# Patient Record
Sex: Female | Born: 1935 | Race: White | Hispanic: No | Marital: Married | Smoking: Never smoker
Health system: Southern US, Community
[De-identification: ages and names within clinical notes are randomized; demographics above are authoritative.]

## PROBLEM LIST (undated history)

## (undated) DIAGNOSIS — F0390 Unspecified dementia without behavioral disturbance: Secondary | ICD-10-CM

## (undated) DIAGNOSIS — K219 Gastro-esophageal reflux disease without esophagitis: Secondary | ICD-10-CM

## (undated) DIAGNOSIS — I509 Heart failure, unspecified: Secondary | ICD-10-CM

## (undated) DIAGNOSIS — I4891 Unspecified atrial fibrillation: Secondary | ICD-10-CM

## (undated) DIAGNOSIS — I10 Essential (primary) hypertension: Secondary | ICD-10-CM

## (undated) HISTORY — PX: ABDOMINAL HYSTERECTOMY: SHX81

## (undated) HISTORY — PX: CHOLECYSTECTOMY: SHX55

## (undated) HISTORY — DX: Essential (primary) hypertension: I10

## (undated) HISTORY — DX: Gastro-esophageal reflux disease without esophagitis: K21.9

## (undated) HISTORY — PX: HERNIA REPAIR: SHX51

---

## 2009-12-27 ENCOUNTER — Ambulatory Visit (HOSPITAL_COMMUNITY): Admission: RE | Admit: 2009-12-27 | Discharge: 2009-12-27 | Payer: Self-pay | Admitting: Ophthalmology

## 2010-01-03 ENCOUNTER — Ambulatory Visit (HOSPITAL_COMMUNITY): Admission: RE | Admit: 2010-01-03 | Discharge: 2010-01-03 | Payer: Self-pay | Admitting: Ophthalmology

## 2010-05-24 LAB — POCT I-STAT 4, (NA,K, GLUC, HGB,HCT)
Glucose, Bld: 97 mg/dL (ref 70–99)
HCT: 37 % (ref 36.0–46.0)
Hemoglobin: 12.6 g/dL (ref 12.0–15.0)
Potassium: 3.4 mEq/L — ABNORMAL LOW (ref 3.5–5.1)
Potassium: 3.8 mEq/L (ref 3.5–5.1)
Sodium: 142 mEq/L (ref 135–145)

## 2015-03-13 HISTORY — PX: BRONCHOSCOPY: SUR163

## 2015-10-16 ENCOUNTER — Emergency Department (HOSPITAL_COMMUNITY)
Admission: EM | Admit: 2015-10-16 | Discharge: 2015-10-16 | Disposition: A | Discharge status: Home / Self Care | Payer: Medicare Other | Attending: Emergency Medicine | Admitting: Emergency Medicine

## 2015-10-16 ENCOUNTER — Emergency Department (HOSPITAL_COMMUNITY): Payer: Medicare Other

## 2015-10-16 ENCOUNTER — Encounter (HOSPITAL_COMMUNITY): Payer: Self-pay

## 2015-10-16 DIAGNOSIS — I10 Essential (primary) hypertension: Secondary | ICD-10-CM | POA: Insufficient documentation

## 2015-10-16 DIAGNOSIS — R5383 Other fatigue: Secondary | ICD-10-CM | POA: Diagnosis not present

## 2015-10-16 DIAGNOSIS — R05 Cough: Secondary | ICD-10-CM | POA: Insufficient documentation

## 2015-10-16 DIAGNOSIS — Z79899 Other long term (current) drug therapy: Secondary | ICD-10-CM | POA: Insufficient documentation

## 2015-10-16 DIAGNOSIS — R600 Localized edema: Secondary | ICD-10-CM | POA: Diagnosis not present

## 2015-10-16 DIAGNOSIS — Z7982 Long term (current) use of aspirin: Secondary | ICD-10-CM | POA: Diagnosis not present

## 2015-10-16 DIAGNOSIS — R609 Edema, unspecified: Secondary | ICD-10-CM

## 2015-10-16 DIAGNOSIS — I6782 Cerebral ischemia: Secondary | ICD-10-CM | POA: Insufficient documentation

## 2015-10-16 HISTORY — DX: Unspecified atrial fibrillation: I48.91

## 2015-10-16 HISTORY — DX: Essential (primary) hypertension: I10

## 2015-10-16 LAB — URINALYSIS, ROUTINE W REFLEX MICROSCOPIC
BILIRUBIN URINE: NEGATIVE
Glucose, UA: NEGATIVE mg/dL
KETONES UR: NEGATIVE mg/dL
Leukocytes, UA: NEGATIVE
Nitrite: NEGATIVE
PROTEIN: 100 mg/dL — AB
Specific Gravity, Urine: 1.025 (ref 1.005–1.030)
pH: 6.5 (ref 5.0–8.0)

## 2015-10-16 LAB — HEPATIC FUNCTION PANEL
ALBUMIN: 3.5 g/dL (ref 3.5–5.0)
ALT: 13 U/L — ABNORMAL LOW (ref 14–54)
AST: 22 U/L (ref 15–41)
Alkaline Phosphatase: 62 U/L (ref 38–126)
BILIRUBIN DIRECT: 0.4 mg/dL (ref 0.1–0.5)
BILIRUBIN TOTAL: 1.3 mg/dL — AB (ref 0.3–1.2)
Indirect Bilirubin: 0.9 mg/dL (ref 0.3–0.9)
Total Protein: 7.1 g/dL (ref 6.5–8.1)

## 2015-10-16 LAB — CBG MONITORING, ED: GLUCOSE-CAPILLARY: 104 mg/dL — AB (ref 65–99)

## 2015-10-16 LAB — CBC
HCT: 31.5 % — ABNORMAL LOW (ref 36.0–46.0)
Hemoglobin: 9.7 g/dL — ABNORMAL LOW (ref 12.0–15.0)
MCH: 25.9 pg — AB (ref 26.0–34.0)
MCHC: 30.8 g/dL (ref 30.0–36.0)
MCV: 84.2 fL (ref 78.0–100.0)
PLATELETS: 227 10*3/uL (ref 150–400)
RBC: 3.74 MIL/uL — AB (ref 3.87–5.11)
RDW: 16.7 % — ABNORMAL HIGH (ref 11.5–15.5)
WBC: 7.6 10*3/uL (ref 4.0–10.5)

## 2015-10-16 LAB — BASIC METABOLIC PANEL
Anion gap: 6 (ref 5–15)
BUN: 17 mg/dL (ref 6–20)
CALCIUM: 8.2 mg/dL — AB (ref 8.9–10.3)
CO2: 24 mmol/L (ref 22–32)
CREATININE: 0.76 mg/dL (ref 0.44–1.00)
Chloride: 109 mmol/L (ref 101–111)
GFR calc non Af Amer: >60 mL/min (ref >60)
Glucose, Bld: 112 mg/dL — ABNORMAL HIGH (ref 65–99)
Potassium: 3.8 mmol/L (ref 3.5–5.1)
SODIUM: 139 mmol/L (ref 135–145)

## 2015-10-16 LAB — URINE MICROSCOPIC-ADD ON

## 2015-10-16 LAB — TSH: TSH: 2.315 u[IU]/mL (ref 0.350–4.500)

## 2015-10-16 LAB — TROPONIN I: Troponin I: <0.03 ng/mL (ref <0.03)

## 2015-10-16 MED ORDER — FUROSEMIDE 20 MG PO TABS: 20.0000 mg | ORAL_TABLET | Freq: Every day | ORAL | 0 refills | Status: DC

## 2015-10-16 NOTE — ED Provider Notes (Signed)
[facility]-EM[facility]Y DEPT Provider Note   CSN: [id number] Arrival date & time: 10/16/15  1121  First Provider Contact:  11:47 AM    By signing my name below, I, Placido Sou, attest that this documentation has been prepared under the direction and in the presence of Eber Hong, MD. Electronically Signed: Placido Sou, ED Scribe. 10/16/15. 12:01 PM.   History   Chief Complaint Chief Complaint  Patient presents with  . Fatigue    HPI HPI Comments: Emily [name] is a 82 y.o. female who presents to the Emergency Department complaining of constant, moderate, fatigue x 2 weeks. Her daughter states she has been "sleeping day and night". Her daughter states that in March she aspirated a pill and had a bronchoscopy performed to remove the pill and went into a-fib for the first time while at the hospital. Since, her daughter states her a-fib has continued and she was recently dx with LE cellulitis and recently completed keflex. Her daughter states she had blood work completed 2 weeks ago showing low hemoglobin and iron levels and was put on gluconate 325 mg, 2x per day and her daughter states her symptoms have progressively worsened. She takes metoprolol and Cardizem daily and additionally is on Xarelto. Her daughter denies she has began any recent new medications. Her daughter denies she has been dx with CHF. Her daughter reports she began experiencing a mild cough a few days ago. Her daughter states she is supposed to use O2 at night but is not compliant. Her daughter denies she has experienced n/v/d, bloody stools and black/tarry stools.   The history is provided by the patient and a relative. No language interpreter was used.    Past Medical History:  Diagnosis Date  . A-fib (HCC)   . Hypertension     There are no active problems to display for this patient.   Past Surgical History:  Procedure Laterality Date  . ABDOMINAL HYSTERECTOMY    . CESAREAN SECTION    . CHOLECYSTECTOMY    .  HERNIA REPAIR      OB History    No data available       Home Medications    Prior to Admission medications   Medication Sig Start Date End Date Taking? Authorizing Provider  aspirin EC 81 MG tablet Take 81 mg by mouth daily.   Yes Historical Provider, MD  Calcium Carbonate (CALCIUM 600 PO) Take 600 mg by mouth 2 (two) times daily.   Yes Historical Provider, MD  cetirizine (ZYRTEC) 10 MG tablet Take 10 mg by mouth daily.   Yes Historical Provider, MD  diltiazem (CARDIZEM) 120 MG tablet Take 120 mg by mouth 2 (two) times daily.   Yes Historical Provider, MD  famotidine (PEPCID) 20 MG tablet Take 20 mg by mouth at bedtime.   Yes Historical Provider, MD  Ferrous Fumarate (HEMOCYTE - 106 MG FE) 324 (106 Fe) MG TABS tablet Take 1 tablet by mouth 2 (two) times daily.   Yes Historical Provider, MD  metoprolol (LOPRESSOR) 50 MG tablet Take 50 mg by mouth 2 (two) times daily.   Yes Historical Provider, MD  montelukast (SINGULAIR) 10 MG tablet Take 10 mg by mouth every evening.   Yes Historical Provider, MD  oxybutynin (DITROPAN) 5 MG tablet Take 5 mg by mouth daily.   Yes Historical Provider, MD  pantoprazole (PROTONIX) 40 MG tablet Take 40 mg by mouth daily.   Yes Historical Provider, MD  rivaroxaban (XARELTO) 20 MG TABS tablet Take 20  mg by mouth daily with supper.   Yes Historical Provider, MD  Vitamin D, Cholecalciferol, 1000 units TABS Take 1,000 mg by mouth daily.   Yes Historical Provider, MD  furosemide (LASIX) 20 MG tablet Take 1 tablet (20 mg total) by mouth daily. 10/16/15 10/26/15  Eber Hong, MD    Family History No family history on file.  Social History Social History  Substance Use Topics  . Smoking status: Never Smoker  . Smokeless tobacco: Never Used  . Alcohol use No     Allergies   Sulfa antibiotics and Quinine derivatives   Review of Systems Review of Systems  Constitutional: Positive for activity change and fatigue.  Respiratory: Positive for cough.     Cardiovascular: Positive for leg swelling.  Gastrointestinal: Negative for blood in stool, diarrhea, nausea and vomiting.   Physical Exam Updated Vital Signs BP 123/88   Pulse 92   Temp 97.1 F (36.2 C) (Oral)   Resp 26   Ht 5\' 4"  (1.626 m)   Wt 235 lb (106.6 kg)   SpO2 92%   BMI 40.34 kg/m   Physical Exam  Constitutional: She is oriented to person, place, and time. She appears well-developed. No distress.  HENT:  Head: Normocephalic and atraumatic.  Eyes: EOM are normal.  Neck: Normal range of motion.  Cardiovascular: Normal rate.   Atrial fibrillation   Pulmonary/Chest: Effort normal and breath sounds normal.  Abdominal: Soft. She exhibits no distension. There is no tenderness.  Musculoskeletal: Normal range of motion.  2+ edema to BLE from mid-thighs downward   Neurological: She is alert and oriented to person, place, and time.  Speech is clear, cranial nerves III through XII are intact, memory is intact, strength is normal in all 4 extremities including grips, sensation is intact to light touch and pinprick in all 4 extremities. Coordination as tested by finger-nose-finger is normal, no limb ataxia.   Skin: Skin is warm and dry.  Psychiatric: She has a normal mood and affect. Judgment normal.  Nursing note and vitals reviewed.  ED Treatments / Results  Labs (all labs ordered are listed, but only abnormal results are displayed) Labs Reviewed  BASIC METABOLIC PANEL - Abnormal; Notable for the following:       Result Value   Glucose, Bld 112 (*)    Calcium 8.2 (*)    All other components within normal limits  CBC - Abnormal; Notable for the following:    RBC 3.74 (*)    Hemoglobin 9.7 (*)    HCT 31.5 (*)    MCH 25.9 (*)    RDW 16.7 (*)    All other components within normal limits  URINALYSIS, ROUTINE W REFLEX MICROSCOPIC (NOT AT [facility]) - Abnormal; Notable for the following:    Hgb urine dipstick MODERATE (*)    Protein, ur 100 (*)    All other components within  normal limits  HEPATIC FUNCTION PANEL - Abnormal; Notable for the following:    ALT 13 (*)    Total Bilirubin 1.3 (*)    All other components within normal limits  URINE MICROSCOPIC-ADD ON - Abnormal; Notable for the following:    Squamous Epithelial / LPF TOO NUMEROUS TO COUNT (*)    Bacteria, UA FEW (*)    All other components within normal limits  CBG MONITORING, ED - Abnormal; Notable for the following:    Glucose-Capillary 104 (*)    All other components within normal limits  TSH  TROPONIN I  EKG  EKG Interpretation None       Radiology Ct Head Wo Contrast  Result Date: 10/16/2015 CLINICAL DATA:  Fatigue for 2 weeks. EXAM: CT HEAD WITHOUT CONTRAST TECHNIQUE: Contiguous axial images were obtained from the base of the skull through the vertex without intravenous contrast. COMPARISON:  None. FINDINGS: Brain: There is mild generalized age related parenchymal atrophy with commensurate dilatation of the ventricles and sulci. Mild chronic small vessel ischemic change noted in the deep periventricular white matter regions bilaterally. Probable small old lacunar infarcts within the basal ganglia. There is no mass, hemorrhage, edema or other evidence of acute parenchymal abnormality. No extra-axial hemorrhage. Vascular: No hyperdense vessel or unexpected calcification. Skull: Negative for fracture or focal lesion. Sinuses/Orbits: No acute findings. Visualized upper paranasal sinuses are clear. Mastoid air cells are clear. Other: None. IMPRESSION: 1. No acute intracranial abnormality. No intracranial mass, hemorrhage or edema. 2. Mild atrophy and chronic ischemic changes, as detailed above. Electronically Signed   By: Bary RichardStan  Maynard M.D.   On: 10/16/2015 15:09   Dg Chest Port 1 View  Result Date: 10/16/2015 CLINICAL DATA:  82 year old female with history of shortness of breath, cough and weakness with fatigue for the past 2 weeks. EXAM: PORTABLE CHEST 1 VIEW COMPARISON:  Chest x-ray  07/20/2015.  Chest CT 07/21/2015. FINDINGS: New airspace consolidation in the right upper lobe concerning for pneumonia. Lung volumes are low. Left lung is clear. No pleural effusions. No evidence of pulmonary edema. Mild cardiomegaly. Upper mediastinal contours are distorted by patient positioning. Aortic atherosclerosis. IMPRESSION: 1. New right upper lobe opacity concerning for pneumonia. Followup PA and lateral chest X-ray is recommended in 3-4 weeks following trial of antibiotic therapy to ensure resolution and exclude underlying malignancy. 2. Mild cardiomegaly. 3. Aortic atherosclerosis. Electronically Signed   By: Trudie Reedaniel  Entrikin M.D.   On: 10/16/2015 12:28    Procedures Procedures  DIAGNOSTIC STUDIES: Oxygen Saturation is 99% on RA, normal by my interpretation.    COORDINATION OF CARE: 11:58 AM Discussed next steps with pt and her daughter. They verbalized understanding and are agreeable with the plan.    Medications Ordered in ED Medications - No data to display   Initial Impression / Assessment and Plan / ED Course  I have reviewed the triage vital signs and the nursing notes.  Pertinent labs & imaging results that were available during my care of the patient were reviewed by me and considered in my medical decision making (see chart for details).  Clinical Course  Comment By Time  Well appearing throughout the ED stay Labs ordered and reviewed CXR ordered and reviewed CT reviewed No acute fidnigns - VS remained stable Can f/u outpatient Peripheral edema - 10 days of lasix  Daughter in agreement Eber HongBrian Murry Khiev, MD 08/06 1530   I personally performed the services described in this documentation, which was scribed in my presence. The recorded information has been reviewed and is accurate.     Final Clinical Impressions(s) / ED Diagnoses   Final diagnoses:  Other fatigue  Peripheral edema    New Prescriptions New Prescriptions   FUROSEMIDE (LASIX) 20 MG TABLET     Take 1 tablet (20 mg total) by mouth daily.     Eber HongBrian Jahnasia Tatum, MD 10/16/15 [phone]

## 2015-10-16 NOTE — ED Notes (Signed)
Pt made aware to return if symptoms worsen or if any life threatening symptoms occur.   

## 2015-10-16 NOTE — ED Notes (Signed)
Pt placed on 2L 02 nasal cannula per order.

## 2015-10-16 NOTE — Discharge Instructions (Signed)
Please call your doctor today, make an appointment for the next couple of days, you should be seen to have a discussion regarding your medications as this may be causing some sleepiness, there was no obvious answer based on blood work x-rays or CT scan. Please take Lasix once daily for the next 10 days to help with some of the swelling in her legs. Return to the emergency department for severe or worsening symptoms

## 2015-10-16 NOTE — ED Triage Notes (Signed)
Patient complains of fatigue x 2 weeks. States she has been sleeping almost all day and night. Had blood work 2 weeks ago and noted to have low hemoglobin per daughter.

## 2017-05-28 ENCOUNTER — Encounter: Payer: Self-pay | Admitting: Gastroenterology

## 2017-05-28 ENCOUNTER — Ambulatory Visit (INDEPENDENT_AMBULATORY_CARE_PROVIDER_SITE_OTHER): Payer: Medicare Other | Admitting: Gastroenterology

## 2017-05-28 DIAGNOSIS — K649 Unspecified hemorrhoids: Secondary | ICD-10-CM | POA: Diagnosis not present

## 2017-05-28 MED ORDER — HYDROCORTISONE 2.5 % RE CREA
1.0000 | TOPICAL_CREAM | Freq: Two times a day (BID) | RECTAL | 1 refills | Status: DC
Start: 2017-05-28 — End: 2017-09-03

## 2017-05-28 NOTE — Patient Instructions (Addendum)
I have given you samples of Amitiza for constipation. This is going to help you have more spontaneous, productive bowel movements. Take this twice a day WITH FOOD to avoid nausea. We can increase the dosage if we need to!  I have sent in anusol cream to use twice a day per rectum. If you feel you need stronger, I can send something to [facility] that may be around 40-45$. Just let me know.  I will see you in about 4 weeks. We can take a better look and reassess the options. The most important thing is to develop a good bowel regimen, avoid straining, and have improvement in symptoms.   It was a pleasure to see you today. I strive to create trusting relationships with patients to provide genuine, compassionate, and quality care. I value your feedback. If you receive a survey regarding your visit,  I greatly appreciate you taking time to fill this out.   Gelene MinkAnna W. Sherita Decoste, PhD, ANP-BC [facility]    Hemorrhoids Hemorrhoids are swollen veins in and around the rectum or anus. There are two types of hemorrhoids:  Internal hemorrhoids. These occur in the veins that are just inside the rectum. They may poke through to the outside and become irritated and painful.  External hemorrhoids. These occur in the veins that are outside of the anus and can be felt as a painful swelling or hard lump near the anus.  Most hemorrhoids do not cause serious problems, and they can be managed with home treatments such as diet and lifestyle changes. If home treatments do not help your symptoms, procedures can be done to shrink or remove the hemorrhoids. What are the causes? This condition is caused by increased pressure in the anal area. This pressure may result from various things, including:  Constipation.  Straining to have a bowel movement.  Diarrhea.  Pregnancy.  Obesity.  Sitting for long periods of time.  Heavy lifting or other activity that causes you to strain.  Anal  sex.  What are the signs or symptoms? Symptoms of this condition include:  Pain.  Anal itching or irritation.  Rectal bleeding.  Leakage of stool (feces).  Anal swelling.  One or more lumps around the anus.  How is this diagnosed? This condition can often be diagnosed through a visual exam. Other exams or tests may also be done, such as:  Examination of the rectal area with a gloved hand (digital rectal exam).  Examination of the anal canal using a small tube (anoscope).  A blood test, if you have lost a significant amount of blood.  A test to look inside the colon (sigmoidoscopy or colonoscopy).  How is this treated? This condition can usually be treated at home. However, various procedures may be done if dietary changes, lifestyle changes, and other home treatments do not help your symptoms. These procedures can help make the hemorrhoids smaller or remove them completely. Some of these procedures involve surgery, and others do not. Common procedures include:  Rubber band ligation. Rubber bands are placed at the base of the hemorrhoids to cut off the blood supply to them.  Sclerotherapy. Medicine is injected into the hemorrhoids to shrink them.  Infrared coagulation. A type of light energy is used to get rid of the hemorrhoids.  Hemorrhoidectomy surgery. The hemorrhoids are surgically removed, and the veins that supply them are tied off.  Stapled hemorrhoidopexy surgery. A circular stapling device is used to remove the hemorrhoids and use staples to cut off  the blood supply to them.  Follow these instructions at home: Eating and drinking  Eat foods that have a lot of fiber in them, such as whole grains, beans, nuts, fruits, and vegetables. Ask your health care provider about taking products that have added fiber (fiber supplements).  Drink enough fluid to keep your urine clear or pale yellow. Managing pain and swelling  Take warm sitz baths for 20 minutes, 3-4 times  a day to ease pain and discomfort.  If directed, apply ice to the affected area. Using ice packs between sitz baths may be helpful. ? Put ice in a plastic bag. ? Place a towel between your skin and the bag. ? Leave the ice on for 20 minutes, 2-3 times a day. General instructions  Take over-the-counter and prescription medicines only as told by your health care provider.  Use medicated creams or suppositories as told.  Exercise regularly.  Go to the bathroom when you have the urge to have a bowel movement. Do not wait.  Avoid straining to have bowel movements.  Keep the anal area dry and clean. Use wet toilet paper or moist towelettes after a bowel movement.  Do not sit on the toilet for long periods of time. This increases blood pooling and pain. Contact a health care provider if:  You have increasing pain and swelling that are not controlled by treatment or medicine.  You have uncontrolled bleeding.  You have difficulty having a bowel movement, or you are unable to have a bowel movement.  You have pain or inflammation outside the area of the hemorrhoids. This information is not intended to replace advice given to you by your health care provider. Make sure you discuss any questions you have with your health care provider. Document Released: 02/24/2000 Document Revised: 07/27/2015 Document Reviewed: 11/10/2014 Elsevier Interactive Patient Education  [employer].

## 2017-05-29 ENCOUNTER — Encounter: Payer: Self-pay | Admitting: Internal Medicine

## 2017-05-30 ENCOUNTER — Encounter: Payer: Self-pay | Admitting: Gastroenterology

## 2017-05-30 NOTE — Progress Notes (Signed)
Primary Care Physician:  Kirstie Peri, MD Primary Gastroenterologist:  Dr. Jena Gauss   Chief Complaint  Patient presents with  . Hemorrhoids    off/on bleeding    HPI:   Emily [name] is a 84 y.o. female presenting today at the request of Dr. Sherryll Burger secondary to hemorrhoids. Her daughter is present with her today. Patient lives at daughter's home. Patient reports to sitting long periods of time. Has had chronic hemorrhoids but have worsened over the past few years. States the hemorrhoids are "huge" at times. Rectal bleeding intermittently for the past 6 months. Associated rectal pain, sometimes knife-like pain intermittently. Sitz baths have helped in the past but now not as helpful. Has tried multiple OTC agents. Supplemental fiber taken. Endorses straining although stool is soft. Feels like stool is not productive. No abdominal pain. Has a good appetite. Has a BM at least daily. Sometimes has to pick stool out. Takes 1/2 dose of Miralax in coffee each morning. Colonoscopy greater than 10 years ago per patient. No polyps.   Past Medical History:  Diagnosis Date  . A-fib (HCC)   . GERD (gastroesophageal reflux disease)   . HTN (hypertension)   . Hypertension     Past Surgical History:  Procedure Laterality Date  . ABDOMINAL HYSTERECTOMY    . BRONCHOSCOPY  2017   aspirated what was felt to be iron tablet, evaluated at [facility] with bronchs  . CESAREAN SECTION    . CHOLECYSTECTOMY    . HERNIA REPAIR      Current Outpatient Medications  Medication Sig Dispense Refill  . aspirin EC 81 MG tablet Take 81 mg by mouth daily.    . cetirizine (ZYRTEC) 10 MG tablet Take 10 mg by mouth daily.    Marland Kitchen diltiazem (CARDIZEM) 120 MG tablet Take 120 mg by mouth every morning.     . donepezil (ARICEPT) 5 MG tablet Take 5 mg by mouth at bedtime.    . famotidine (PEPCID) 20 MG tablet Take 20 mg by mouth at bedtime.    . Ferrous Fumarate (HEMOCYTE - 106 MG FE) 324 (106 Fe) MG TABS tablet Take 1 tablet by  mouth daily.     . metoprolol (LOPRESSOR) 50 MG tablet Take 50 mg by mouth 2 (two) times daily.    . montelukast (SINGULAIR) 10 MG tablet Take 10 mg by mouth every evening.    . Multiple Vitamin (MULTIVITAMIN) tablet Take 1 tablet by mouth daily.    Marland Kitchen oxybutynin (DITROPAN) 5 MG tablet Take 5 mg by mouth daily.    . TORSEMIDE PO Take 50 mg by mouth every other day.    . furosemide (LASIX) 20 MG tablet Take 1 tablet (20 mg total) by mouth daily. 10 tablet 0  . hydrocortisone (ANUSOL-HC) 2.5 % rectal cream Place 1 application rectally 2 (two) times daily. 30 g 1   No current facility-administered medications for this visit.     Allergies as of 05/28/2017 - Review Complete 05/28/2017  Allergen Reaction Noted  . Sulfa antibiotics Rash 05/22/2015  . Quinine derivatives Other (See Comments) 10/16/2015    Family History  Problem Relation Age of Onset  . Colon cancer Neg Hx   . Colon polyps Neg Hx     Social History   Socioeconomic History  . Marital status: Married    Spouse name: Not on file  . Number of children: Not on file  . Years of education: Not on file  . Highest education level: Not on  file  Occupational History  . Not on file  Social Needs  . Financial resource strain: Not on file  . Food insecurity:    Worry: Not on file    Inability: Not on file  . Transportation needs:    Medical: Not on file    Non-medical: Not on file  Tobacco Use  . Smoking status: Never Smoker  . Smokeless tobacco: Never Used  Substance and Sexual Activity  . Alcohol use: No  . Drug use: No  . Sexual activity: Not on file  Lifestyle  . Physical activity:    Days per week: Not on file    Minutes per session: Not on file  . Stress: Not on file  Relationships  . Social connections:    Talks on phone: Not on file    Gets together: Not on file    Attends religious service: Not on file    Active member of club or organization: Not on file    Attends meetings of clubs or organizations:  Not on file    Relationship status: Not on file  . Intimate partner violence:    Fear of current or ex partner: Not on file    Emotionally abused: Not on file    Physically abused: Not on file    Forced sexual activity: Not on file  Other Topics Concern  . Not on file  Social History Narrative  . Not on file    Review of Systems: Gen: Denies any fever, chills, fatigue, weight loss, lack of appetite.  CV: Denies chest pain, heart palpitations, peripheral edema, syncope.  Resp: Denies shortness of breath at rest or with exertion. Denies wheezing or cough.  GI: see HPI  GU : Denies urinary burning, urinary frequency, urinary hesitancy MS: Denies joint pain, muscle weakness, cramps, or limitation of movement.  Derm: Denies rash, itching, dry skin Psych: Denies depression, anxiety, memory loss, and confusion Heme: see HPI   Physical Exam: BP 127/72   Pulse 66   Temp (!) 97.1 F (36.2 C) (Oral)   Ht  (1.626 m)   Wt 245 lb 3.2 oz (111.2 kg)   BMI 42.09 kg/m  General:   Alert and oriented. Pleasant and cooperative. Well-nourished and well-developed.  Head:  Normocephalic and atraumatic. Eyes:  Without icterus, sclera clear and conjunctiva pink.  Ears:  Normal auditory acuity. Nose:  No deformity, discharge,  or lesions. Mouth:  No deformity or lesions, oral mucosa pink.  Lungs:  Clear to auscultation bilaterally. No wheezes, rales, or rhonchi. No distress.  Heart:  S1, S2 present irregularly irregular  Abdomen:  +BS, soft, non-tender and non-distended.limited exam with patient in chair Rectal:  Unable to lay in left lateral decubitus position on exam table, as she was unable to get on this even with assistance. She was able to lean over, and she had what appears to be Grade 3 prolapsing internal hemorrhoids, non-thrombosed, able to reduce with digital exam. Unable to adequately evaluate internally due to patient position  Msk:  With kyphosis  Extremities:  With chronic lower  extremity edema, ?lymphedema component  Neurologic:  Alert and  oriented x4 Psych:  Alert and cooperative. Normal mood and affect.

## 2017-05-30 NOTE — Progress Notes (Signed)
CC'ED TO PCP 

## 2017-05-30 NOTE — Assessment & Plan Note (Signed)
Very pleasant 84 year old female with chronic history of hemorrhoids per her report, now with worsening symptoms, intermittent discomfort and low-volume hematochezia. Rectal exam limited as she was unable to get on table, but I appreciated what appeared to be likely Grade 3 non-thrombosed hemorrhoids. Reduced manually. Unable to adequately perform internal rectal exam.   Discussed at length more aggressive bowel regimen, as she notes sitting for long periods of time, straining, and needs to avoid both of these with improved bowel regimen in order to note any improvement. We discussed possible hemorrhoid banding as well, and the risks and benefits of colonoscopy vs flex sig with subsequent banding as we are unable to completely exclude any other etiologies. For now, will add Amitiza 8 mcg po BID, samples provided. Anusol sent to pharmacy. If this is not helpful, we may need to do the [facility] compound cream. Return in 4 weeks. I briefly discussed case with Dr. Jena Gaussourk. As patient is older, she may elect to just do hemorrhoid banding in future and forgo colonoscopy, with the knowledge that an occult lesion could be present. If banding did not improve symptoms, could pursue colonoscopy. However, if she is willing, would pursue flex sig vs colonoscopy with banding thereafter. I did discuss with her that I was not completely sure she would be an appropriate candidate for banding, especially if hemorrhoids worsen. For now, she could be a potential candidate with Grade 3 but any worsening would not be amenable to banding. Will maximize supportive measures for now and discuss in 4 weeks.

## 2017-06-03 ENCOUNTER — Telehealth: Payer: Self-pay | Admitting: Internal Medicine

## 2017-06-03 MED ORDER — LUBIPROSTONE 8 MCG PO CAPS: 8.0000 ug | ORAL_CAPSULE | Freq: Two times a day (BID) | ORAL | 3 refills | Status: DC

## 2017-06-03 NOTE — Telephone Encounter (Signed)
Forwarding to the refill box.  

## 2017-06-03 NOTE — Telephone Encounter (Signed)
Done

## 2017-06-03 NOTE — Addendum Note (Signed)
Addended by: Gelene MinkBOONE, Rolando Hessling W on: 06/03/2017 12:42 PM   Modules accepted: Orders

## 2017-06-03 NOTE — Telephone Encounter (Signed)
PATIENT DAUGHTER SAID THE AMITEZA SAMPLES WORKED WELL AND WOULD LIKE A PRESCRIPTION SENT TO HER PHARMACY

## 2017-06-26 ENCOUNTER — Ambulatory Visit: Payer: Medicare Other | Admitting: Gastroenterology

## 2017-07-01 ENCOUNTER — Ambulatory Visit: Payer: Medicare Other | Admitting: Gastroenterology

## 2017-07-25 ENCOUNTER — Encounter: Payer: Self-pay | Admitting: Cardiovascular Disease

## 2017-07-30 ENCOUNTER — Encounter: Payer: Self-pay | Admitting: Gastroenterology

## 2017-07-30 ENCOUNTER — Ambulatory Visit (INDEPENDENT_AMBULATORY_CARE_PROVIDER_SITE_OTHER): Payer: Medicare Other | Admitting: Gastroenterology

## 2017-07-30 VITALS — BP 107/60 | HR 81 | Temp 97.7°F | Ht 64.0 in | Wt 249.6 lb

## 2017-07-30 DIAGNOSIS — K649 Unspecified hemorrhoids: Secondary | ICD-10-CM

## 2017-07-30 NOTE — Patient Instructions (Addendum)
I called in the [facility] and they will be calling you for more information and review pricing.   Use the suppositories twice a day for the next 7 days.  The cream can be as needed during a flare, which you can use 2-4 times a day.   I will see you in 4 months or sooner if needed! Please call with any issues whatsoever.  Continue taking the Amitiza twice a day with food. Please call if we need to adjust this dosage in the future.  It was wonderful to see you again!!!! I look forward to seeing you in the fall  Gelene Mink, PhD, [facility]

## 2017-07-30 NOTE — Progress Notes (Signed)
Primary Care Physician:  Kirstie Peri, MD  Primary GI: Dr. Jena Gauss   Chief Complaint  Patient presents with  . Hemorrhoids    HPI:   Emily [name] is an 84 y.o. female presenting today with a history of hemorrhoids, constipation, last seen in March 2019. Limited view of rectum at that time, as she was unable to get on the exam table easily. Appeared to have possibly Grade 3 hemorrhoids, non-thrombosed, reduced manually. At that time, started on Amitiza 8 mcg. Anusol provided as well. Discussed possibility of hemorrhoid banding with colonoscopy at a later date only if persistent issues. Could also consider flex sig with outpatient banding thereafter, knowing an occult lesion could be present.   Presents today with her daughter, [name]. In interim from last visit, she was hospitalized at [facility] due to electrolyte abnormalities, dehydration, hypotension. Diuretic therapy has been adjusted. Daughter states dealing with heart failure recently. Amitiza 8 mcg BID helped significantly with bowel movements. Daughter has also added colace in the evenings. She did have an issue with impaction in interim, presenting to [facility]. Received enemas and had good results. AAS from [facility] with stool throughout colon April 2019. She has done well since that time. Will have occasional episodes of bleeding, with intermittent prolapsing of hemorrhoids. She will go spans of time without any bleeding. She has no pain. She wonders if she is a candidate for hemorrhoid banding. She also tells me she is wearing oxygen prn as well. Seems to have declined some in health. Daughter feels hemorrhoids are overall improved.   Past Medical History:  Diagnosis Date  . A-fib (HCC)   . GERD (gastroesophageal reflux disease)   . HTN (hypertension)   . Hypertension     Past Surgical History:  Procedure Laterality Date  . ABDOMINAL HYSTERECTOMY    . BRONCHOSCOPY  2017   aspirated what was felt to be iron tablet,  evaluated at [facility] with bronchs  . CESAREAN SECTION    . CHOLECYSTECTOMY    . HERNIA REPAIR      Current Outpatient Medications  Medication Sig Dispense Refill  . aspirin EC 81 MG tablet Take 81 mg by mouth daily.    . cetirizine (ZYRTEC) 10 MG tablet Take 10 mg by mouth daily.    Marland Kitchen diltiazem (CARDIZEM) 120 MG tablet Take 120 mg by mouth every morning.     . donepezil (ARICEPT) 5 MG tablet Take 5 mg by mouth at bedtime.    . famotidine (PEPCID) 20 MG tablet Take 20 mg by mouth at bedtime.    . Ferrous Fumarate (HEMOCYTE - 106 MG FE) 324 (106 Fe) MG TABS tablet Take 1 tablet by mouth daily.     . hydrocortisone (ANUSOL-HC) 2.5 % rectal cream Place 1 application rectally 2 (two) times daily. 30 g 1  . lubiprostone (AMITIZA) 8 MCG capsule Take 1 capsule (8 mcg total) by mouth 2 (two) times daily with a meal. 180 capsule 3  . metoprolol (LOPRESSOR) 50 MG tablet Take 50 mg by mouth 2 (two) times daily.    . montelukast (SINGULAIR) 10 MG tablet Take 10 mg by mouth every evening.    . Multiple Vitamin (MULTIVITAMIN) tablet Take 1 tablet by mouth daily.    . NON FORMULARY Potassium 20 meq   Once a day    . oxybutynin (DITROPAN) 5 MG tablet Take 5 mg by mouth daily.    . TORSEMIDE PO Take 100 mg by mouth daily.     Marland Kitchen  furosemide (LASIX) 20 MG tablet Take 1 tablet (20 mg total) by mouth daily. 10 tablet 0  . hydrocortisone (ANUSOL-HC) 25 MG suppository Place 1 suppository (25 mg total) rectally every 12 (twelve) hours. 24 suppository 1   No current facility-administered medications for this visit.     Allergies as of 07/30/2017 - Review Complete 07/30/2017  Allergen Reaction Noted  . Sulfa antibiotics Rash 05/22/2015  . Quinine derivatives Other (See Comments) 10/16/2015    Family History  Problem Relation Age of Onset  . Colon cancer Neg Hx   . Colon polyps Neg Hx     Social History   Socioeconomic History  . Marital status: Married    Spouse name: Not on file  . Number of  children: Not on file  . Years of education: Not on file  . Highest education level: Not on file  Occupational History  . Not on file  Social Needs  . Financial resource strain: Not on file  . Food insecurity:    Worry: Not on file    Inability: Not on file  . Transportation needs:    Medical: Not on file    Non-medical: Not on file  Tobacco Use  . Smoking status: Never Smoker  . Smokeless tobacco: Never Used  Substance and Sexual Activity  . Alcohol use: No  . Drug use: No  . Sexual activity: Not on file  Lifestyle  . Physical activity:    Days per week: Not on file    Minutes per session: Not on file  . Stress: Not on file  Relationships  . Social connections:    Talks on phone: Not on file    Gets together: Not on file    Attends religious service: Not on file    Active member of club or organization: Not on file    Attends meetings of clubs or organizations: Not on file    Relationship status: Not on file  Other Topics Concern  . Not on file  Social History Narrative  . Not on file    Review of Systems: As mentioned in HPI   Physical Exam: BP 107/60   Pulse 81   Temp 97.7 F (36.5 C) (Oral)   Ht  (1.626 m)   Wt 249 lb 9.6 oz (113.2 kg)   BMI 42.84 kg/m  General:   Alert and oriented. No distress noted. Pleasant and cooperative.  Head:  Normocephalic and atraumatic. Eyes:  Conjuctiva clear without scleral icterus. Mouth:  Oral mucosa pink and moist.  Rectal: unable to get on exam table. Bending over with daughter's assistance, I was able to see the rectum. Appears to possibly be Grade 3-4 non-bleeding hemorrhoids. With rectal exam, I could manually reduce but would not stay reduced. No evidence of thrombosis, bleeding.  Msk:  Kyphosis  Extremities:  Lymphedema bilateral lower extremities  Neurologic:  Alert and  oriented x4 Psych:  Alert and cooperative. Normal mood and affect.

## 2017-07-31 ENCOUNTER — Telehealth: Payer: Self-pay | Admitting: General Practice

## 2017-07-31 MED ORDER — HYDROCORTISONE ACETATE 25 MG RE SUPP: 25.0000 mg | Freq: Two times a day (BID) | RECTAL | 1 refills | Status: AC

## 2017-07-31 NOTE — Telephone Encounter (Signed)
Noted   Sent in

## 2017-07-31 NOTE — Telephone Encounter (Signed)
Completed. I sent in Anusol suppositories, but was this what she is referring to?

## 2017-07-31 NOTE — Telephone Encounter (Signed)
Spoke with pts daughter and the Anusol Suppositories is what she was referring to.

## 2017-07-31 NOTE — Addendum Note (Signed)
Addended by: Gelene Mink on: 07/31/2017 12:00 PM   Modules accepted: Orders

## 2017-07-31 NOTE — Telephone Encounter (Signed)
Patient's daughter, [name] called and wanted to know if we could call in more suppositories for her mom.    She wanted to increase the quantity of the Rx, because they are using them more frequently.   Pts daughter can be reached at  [phone].

## 2017-08-05 NOTE — Assessment & Plan Note (Signed)
Very pleasant delightful 84 year old female presenting again with her daughter for follow-up of hemorrhoids and constipation. Will continue Amitiza 8 mcg dosing BID. I discussed trial of 24 mcg BID, but she feels this is doing well. Still with intermittent rectal bleeding but improved. No significant rectal discomfort. At this point, hemorrhoid flares are more of an "annoyance". Unfortunately, she has had some health issues in the interim, to where I feel it is best to hold off on any flex sig or colonoscopy prior to hemorrhoid banding. In this scenario, I discussed that limited evaluation in the way of flex sig prior to any outpatient hemorrhoid banding would be best, although that does not exclude an occult lesion elsewhere in the remaining colon. As she is improved, we discussed supportive measures for now and allow her to recover from recent heart failure exacerbation. Continue Amitiza dosing, and I have added the [facility] cream for supportive measures. She is to do a 7 day course of BID suppositories starting today, with the compounded hemorrhoid cream in the future as needed. It has been difficult to completely assess her in the office as she is unable to get on the exam table, but overall I think she has symptomatically improved. We will see her again in 4 months. I also discussed with her that outpatient banding is done in our office, and if she is unable to get on table, we would need to discuss other ways to achieve this. For now, will continue to monitor due to other health issues that have arisen.

## 2017-08-06 NOTE — Progress Notes (Signed)
cc'd to pcp 

## 2017-08-15 ENCOUNTER — Encounter

## 2017-08-15 ENCOUNTER — Encounter: Payer: Self-pay | Admitting: Cardiovascular Disease

## 2017-08-15 ENCOUNTER — Ambulatory Visit (INDEPENDENT_AMBULATORY_CARE_PROVIDER_SITE_OTHER): Payer: Medicare Other | Admitting: Cardiovascular Disease

## 2017-08-15 ENCOUNTER — Telehealth: Payer: Self-pay | Admitting: Cardiovascular Disease

## 2017-08-15 ENCOUNTER — Encounter: Payer: Self-pay | Admitting: *Deleted

## 2017-08-15 VITALS — BP 100/50 | HR 89 | Ht 64.0 in | Wt 267.0 lb

## 2017-08-15 DIAGNOSIS — R600 Localized edema: Secondary | ICD-10-CM | POA: Diagnosis not present

## 2017-08-15 DIAGNOSIS — Z01818 Encounter for other preprocedural examination: Secondary | ICD-10-CM

## 2017-08-15 DIAGNOSIS — I5022 Chronic systolic (congestive) heart failure: Secondary | ICD-10-CM | POA: Diagnosis not present

## 2017-08-15 DIAGNOSIS — I481 Persistent atrial fibrillation: Secondary | ICD-10-CM | POA: Diagnosis not present

## 2017-08-15 DIAGNOSIS — I10 Essential (primary) hypertension: Secondary | ICD-10-CM

## 2017-08-15 DIAGNOSIS — I4819 Other persistent atrial fibrillation: Secondary | ICD-10-CM

## 2017-08-15 DIAGNOSIS — Z9289 Personal history of other medical treatment: Secondary | ICD-10-CM

## 2017-08-15 NOTE — Patient Instructions (Addendum)
Medication Instructions:  Continue all current medications.  Labwork: none  Testing/Procedures:  Your physician has requested that you have an echocardiogram. Echocardiography is a painless test that uses sound waves to create images of your heart. It provides your doctor with information about the size and shape of your heart and how well your heart's chambers and valves are working. This procedure takes approximately one hour. There are no restrictions for this procedure. (with contrast)  Office will contact with results via phone or letter.    Follow-Up: 4-6 weeks   Any Other Special Instructions Will Be Listed Below (If Applicable).  If you need a refill on your cardiac medications before your next appointment, please call your pharmacy.

## 2017-08-15 NOTE — Progress Notes (Signed)
CARDIOLOGY CONSULT NOTE  Patient ID: Emily [name] MRN: [id number] DOB/AGE: December 16, 1931 84 y.o.  Admit date: (Not on file) Primary Physician: Kirstie Peri, MD Referring Physician: Kirstie Peri, MD  Reason for Consultation: Paroxysmal atrial fibrillation and lower extremity edema  HPI: Emily [name] is a 84 y.o. female who is being seen today for the evaluation of paroxysmal atrial fibrillation and lower extremity edema at the request of Kirstie Peri, MD.   She was last evaluated by cardiology on 02/05/2017 according to electronic medical record.  The most recent echocardiogram I find in the system is dated 05/26/2015 and showed mildly reduced left ventricular systolic function, LVEF 45 to 40%, mild concentric LVH, diffuse hypokinesis, moderate left atrial and mild right atrial dilatation.  There was mild aortic and tricuspid regurgitation.  I personally reviewed the ECG performed today which demonstrates atrial fibrillation, 71 bpm.  There were nonspecific T wave abnormalities.  She is here with her daughter, Emily [name], who is 1 of 5 children.  She was apparently hospitalized at [facility] between May 17 and May 19.  She had been on 4 days of oral diuretics including torsemide and metolazone but blood pressure was dropping and the patient began to feel progressively weak.  She was found to be hypokalemic.  I do not have hospitalization records but will request them for personal review.  She saw her PCP this past Monday.  She is currently on torsemide 100 mg daily and metolazone 2.5 mg daily.  She is due for blood test this upcoming Monday.  She adheres to a 1200 mg sodium restricted diet and a 2 L fluid restriction.  She denies a history of myocardial infarction.  She also denies having had a cardiac catheterization or stress test.  She said problems began a few years ago when she inadvertently swallowed an iron pill and it had to be extracted.  From that time on she developed  atrial fibrillation.  She has reportedly been on Xarelto in the past but she suffered several side effects and anticoagulation was discontinued.  She weighs at home daily.  Her daughter says that her mother is most comfortable at 230 pounds.  She weighed 252 pounds on 5/24 and weighs 259 pounds today.  She just began the current oral diuretic regimen on June 4.  She also has a history of asthma and receives a breathing treatment once daily.  She is also receiving occupational and physical therapy.  There is no known history of sleep apnea and the patient's daughter says that the patient rarely snores and there have been no witnessed apneic episodes.  I am told venous Dopplers were done 1 to 2 years ago by her PCP.  She uses 2 L of oxygen at home.  The patient denies chest pain and shortness of breath as well as palpitations.  Social history: The patient is originally from the [place] area.  They then moved to [place].  They currently live in [place] and have lived here for at least 20 years.  The patient's family are big [employer] basketball fans.   Allergies  Allergen Reactions  . Sulfa Antibiotics Rash  . Quinine Derivatives Other (See Comments)    Bleeding from mouth. Affteced bone marrow. Decreased WBC.    Current Outpatient Medications  Medication Sig Dispense Refill  . aspirin EC 81 MG tablet Take 81 mg by mouth daily.    . cetirizine (ZYRTEC) 10 MG tablet Take 10  mg by mouth daily.    Marland Kitchen diltiazem (CARDIZEM) 120 MG tablet Take 120 mg by mouth 2 (two) times daily.     Marland Kitchen donepezil (ARICEPT) 5 MG tablet Take 5 mg by mouth at bedtime.    . famotidine (PEPCID) 20 MG tablet Take 20 mg by mouth at bedtime.    . Ferrous Fumarate (HEMOCYTE - 106 MG FE) 324 (106 Fe) MG TABS tablet Take 1 tablet by mouth daily.     . hydrocortisone (ANUSOL-HC) 2.5 % rectal cream Place 1 application rectally 2 (two) times daily. 30 g 1  . hydrocortisone (ANUSOL-HC) 25 MG  suppository Place 1 suppository (25 mg total) rectally every 12 (twelve) hours. 24 suppository 1  . lubiprostone (AMITIZA) 24 MCG capsule Take 24 mcg by mouth daily with breakfast.    . metoprolol (LOPRESSOR) 50 MG tablet Take 50 mg by mouth 2 (two) times daily.    . montelukast (SINGULAIR) 10 MG tablet Take 10 mg by mouth every evening.    . Multiple Vitamin (MULTIVITAMIN) tablet Take 1 tablet by mouth daily.    . NON FORMULARY Potassium 20 meq   Once a day    . oxybutynin (DITROPAN) 5 MG tablet Take 5 mg by mouth daily.    . TORSEMIDE PO Take 100 mg by mouth daily.      No current facility-administered medications for this visit.     Past Medical History:  Diagnosis Date  . A-fib (HCC)   . GERD (gastroesophageal reflux disease)   . HTN (hypertension)   . Hypertension     Past Surgical History:  Procedure Laterality Date  . ABDOMINAL HYSTERECTOMY    . BRONCHOSCOPY  2017   aspirated what was felt to be iron tablet, evaluated at [facility] with bronchs  . CESAREAN SECTION    . CHOLECYSTECTOMY    . HERNIA REPAIR      Social History   Socioeconomic History  . Marital status: Married    Spouse name: Not on file  . Number of children: Not on file  . Years of education: Not on file  . Highest education level: Not on file  Occupational History  . Not on file  Social Needs  . Financial resource strain: Not on file  . Food insecurity:    Worry: Not on file    Inability: Not on file  . Transportation needs:    Medical: Not on file    Non-medical: Not on file  Tobacco Use  . Smoking status: Never Smoker  . Smokeless tobacco: Never Used  Substance and Sexual Activity  . Alcohol use: No  . Drug use: No  . Sexual activity: Not on file  Lifestyle  . Physical activity:    Days per week: Not on file    Minutes per session: Not on file  . Stress: Not on file  Relationships  . Social connections:    Talks on phone: Not on file    Gets together: Not on file    Attends  religious service: Not on file    Active member of club or organization: Not on file    Attends meetings of clubs or organizations: Not on file    Relationship status: Not on file  . Intimate partner violence:    Fear of current or ex partner: Not on file    Emotionally abused: Not on file    Physically abused: Not on file    Forced sexual activity: Not on file  Other Topics  Concern  . Not on file  Social History Narrative  . Not on file     No family history of premature CAD in 1st degree relatives.  Current Meds  Medication Sig  . aspirin EC 81 MG tablet Take 81 mg by mouth daily.  . cetirizine (ZYRTEC) 10 MG tablet Take 10 mg by mouth daily.  Marland Kitchen. diltiazem (CARDIZEM) 120 MG tablet Take 120 mg by mouth 2 (two) times daily.   Marland Kitchen. donepezil (ARICEPT) 5 MG tablet Take 5 mg by mouth at bedtime.  . famotidine (PEPCID) 20 MG tablet Take 20 mg by mouth at bedtime.  . Ferrous Fumarate (HEMOCYTE - 106 MG FE) 324 (106 Fe) MG TABS tablet Take 1 tablet by mouth daily.   . hydrocortisone (ANUSOL-HC) 2.5 % rectal cream Place 1 application rectally 2 (two) times daily.  . hydrocortisone (ANUSOL-HC) 25 MG suppository Place 1 suppository (25 mg total) rectally every 12 (twelve) hours.  Marland Kitchen. lubiprostone (AMITIZA) 24 MCG capsule Take 24 mcg by mouth daily with breakfast.  . metoprolol (LOPRESSOR) 50 MG tablet Take 50 mg by mouth 2 (two) times daily.  . montelukast (SINGULAIR) 10 MG tablet Take 10 mg by mouth every evening.  . Multiple Vitamin (MULTIVITAMIN) tablet Take 1 tablet by mouth daily.  . NON FORMULARY Potassium 20 meq   Once a day  . oxybutynin (DITROPAN) 5 MG tablet Take 5 mg by mouth daily.  . TORSEMIDE PO Take 100 mg by mouth daily.       Review of systems complete and found to be negative unless listed above in HPI    Physical exam Blood pressure (!) 100/50, pulse 89, height 5\' 4"  (1.626 m), weight 267 lb (121.1 kg), SpO2 95 %. General: NAD Neck: No JVD, no thyromegaly or thyroid  nodule.  Lungs: Clear to auscultation bilaterally with normal respiratory effort. CV: Nondisplaced PMI. Regular rate and irregular rhythm, normal S1, S2, no S3, 2/6 systolic murmur heard throughout precordium.  Bilateral lower extremities are markedly edematous.  No carotid bruit.   Abdomen: Soft, nontender, no distention.  Neurologic: Alert and oriented x 3.  Psych: Normal affect. Extremities: No clubbing or cyanosis.  HEENT: Normal.   ECG: Most recent ECG reviewed.   Labs: Lab Results  Component Value Date/Time   K 3.8 10/16/2015 12:06 PM   BUN 17 10/16/2015 12:06 PM   CREATININE 0.76 10/16/2015 12:06 PM   ALT 13 (L) 10/16/2015 12:06 PM   TSH 2.315 10/16/2015 12:06 PM   HGB 9.7 (L) 10/16/2015 12:06 PM     Lipids: No results found for: LDLCALC, LDLDIRECT, CHOL, TRIG, HDL      ASSESSMENT AND PLAN:  1.  Chronic systolic heart failure: She is on torsemide 100 mg daily and metolazone 2.5 mg daily which was begun on June 4.  She has been tried on Lasix in the past without success.  I am told she has significant bilateral venous varicosities.  She also has gross lymphedema.  She is due for upcoming blood work by her PCP next Monday.  She weighs daily at home and adheres to a 1200 mg sodium restricted diet and a 2 L fluid restriction.  I will obtain an echocardiogram with contrast to evaluate cardiac structure and function.  I would like to see what her right ventricular function is to see if there is a component of right heart failure.  She may eventually require right heart catheterization to assess intracardiac pressures.  She is on supplemental potassium 20  mEq daily.  There is no known history of sleep apnea and there have been no witnessed apneic episodes.  2.  Persistent atrial fibrillation: She has not been on anticoagulation due to anemia as per records.  Most recent hemoglobin I find in the electronic medical record was normal at 12.8 on 05/30/2015.  I will obtain her most recent  CBC.  Heart rate is currently controlled on long-acting diltiazem 120 mg twice daily and metoprolol tartrate 50 mg twice daily.  Records indicate she has been on digoxin in the past.  She is on low-dose aspirin.  We talked at length about alternative anticoagulation strategies.  She reportedly sustained side effects from Xarelto.  I talked about the possibility of apixaban.  I also talked about the elevated thromboembolic risk.  3.  Hypertension: Blood pressure is presently controlled on long-acting diltiazem and metoprolol.  No changes.   Disposition: Follow up in 4-6 weeks   Signed: Prentice Docker, M.D., F.A.C.C.  08/15/2017, 8:30 AM

## 2017-08-15 NOTE — Telephone Encounter (Signed)
Pre-cert Verification for the following procedure   [facility]ity [facility]

## 2017-08-19 ENCOUNTER — Ambulatory Visit (HOSPITAL_COMMUNITY): Payer: Medicare Other

## 2017-08-19 ENCOUNTER — Telehealth: Payer: Self-pay | Admitting: *Deleted

## 2017-08-19 ENCOUNTER — Ambulatory Visit: Payer: Medicare Other | Admitting: Cardiovascular Disease

## 2017-08-19 ENCOUNTER — Encounter

## 2017-08-19 NOTE — Telephone Encounter (Signed)
Pt daughter says pt fell over the weekend and was to sore and bruised [facility] - will call back to reschedule. Will forward to provider and nurse as Lorain ChildesFYI

## 2017-08-20 NOTE — Telephone Encounter (Signed)
Noted  

## 2017-08-27 ENCOUNTER — Telehealth: Payer: Self-pay | Admitting: Cardiovascular Disease

## 2017-08-27 NOTE — Telephone Encounter (Signed)
Having problems w/ her BP still and having fluid and the meds aren't working. Pt fell about a week ago. Please give daughter Rosalie DoctorDala a call [phone]

## 2017-08-27 NOTE — Telephone Encounter (Signed)
Returned call to Rosalie DoctorDala (pt's daughter) She states that her mother is having problems with her blood pressure dropping too low (98/52) when the nurse came out to do physical therapy. She continues to weigh around 259 lbs, even after taking torsemide 100 mg daily and metolazone 2.5 mg daily. She states that she feels the fluid pills are not working ans wonders if there is another they could try. Please advise.

## 2017-08-28 ENCOUNTER — Telehealth: Payer: Self-pay | Admitting: Cardiovascular Disease

## 2017-08-28 ENCOUNTER — Other Ambulatory Visit: Payer: Self-pay

## 2017-08-28 DIAGNOSIS — R609 Edema, unspecified: Secondary | ICD-10-CM

## 2017-08-28 NOTE — Telephone Encounter (Signed)
Spoke with pt's daughter. She states that she is wanting to know if the fluid medication could be changed for her mother. Per Dr. Purvis SheffieldKoneswaran, he needs to have pt obtain echo prior to medication change. Pt's daughter voiced understanding. Echo scheduled for Monday 6/24 @ 10:30

## 2017-08-28 NOTE — Telephone Encounter (Signed)
Will need to see echocardiogram results before advising on diuretics.

## 2017-08-28 NOTE — Telephone Encounter (Signed)
Pt's daughter is calling back concerning what she called for yesterday

## 2017-08-30 ENCOUNTER — Telehealth: Payer: Self-pay | Admitting: Cardiovascular Disease

## 2017-08-30 NOTE — Telephone Encounter (Signed)
Emily [name] with [facility] called stating that patient's weight was 255 on 08/27/17 and 260 on 08/30/2017. States that patient is weak and having pain in her feet. Please call Emily [name] [phone].

## 2017-08-30 NOTE — Telephone Encounter (Signed)
Daughter [name](Dala) notified & verbalized understanding.

## 2017-08-30 NOTE — Telephone Encounter (Signed)
[facility]

## 2017-08-30 NOTE — Telephone Encounter (Signed)
Daughter Lear Ng(Dala Stanley) returning call - No chest pain, dizziness, or SOB.  1-2 days c/o pain in feet in general due to her carrying the weight.  30 lbs of fluid gained over the last 3-4 months per daughter.  Saw Dr. Sherryll BurgerShah today.  Suggested that she may need to be admitted over the weekend to get fluid off.  Echo had to be rescheduled to Monday, 09/02/2017.  Very tired & fatigued.  Daughter does state that they plan on taking her to ED in not improving, but wanted to know what cardiology provider would suggest.  Stated that Dr. Sherryll BurgerShah increased KCl from 20meq to 40meq, doing Torsemide 100mg  daily - pmd added another one tab at lunch & is doing the Metolazone 2.5mg  30 minutes prior to the Trosemide.  Stated that none of this seems to be working for her.

## 2017-08-30 NOTE — Telephone Encounter (Signed)
Then she will need hospitalization for IV diuresis. Would recommend [facility].

## 2017-08-30 NOTE — Telephone Encounter (Signed)
Left message to return call 

## 2017-08-31 ENCOUNTER — Encounter (HOSPITAL_COMMUNITY): Payer: Self-pay

## 2017-08-31 ENCOUNTER — Inpatient Hospital Stay (HOSPITAL_COMMUNITY)
Admission: EM | Admit: 2017-08-31 | Discharge: 2017-09-12 | DRG: 291 | Disposition: A | Discharge status: Hospice | Payer: Medicare Other | Attending: Pulmonary Disease | Admitting: Pulmonary Disease

## 2017-08-31 ENCOUNTER — Other Ambulatory Visit: Payer: Self-pay

## 2017-08-31 ENCOUNTER — Emergency Department (HOSPITAL_COMMUNITY): Payer: Medicare Other

## 2017-08-31 DIAGNOSIS — Z7189 Other specified counseling: Secondary | ICD-10-CM | POA: Diagnosis not present

## 2017-08-31 DIAGNOSIS — J9621 Acute and chronic respiratory failure with hypoxia: Secondary | ICD-10-CM | POA: Diagnosis present

## 2017-08-31 DIAGNOSIS — D631 Anemia in chronic kidney disease: Secondary | ICD-10-CM | POA: Diagnosis present

## 2017-08-31 DIAGNOSIS — R579 Shock, unspecified: Secondary | ICD-10-CM | POA: Diagnosis not present

## 2017-08-31 DIAGNOSIS — N183 Chronic kidney disease, stage 3 (moderate): Secondary | ICD-10-CM | POA: Diagnosis present

## 2017-08-31 DIAGNOSIS — Z515 Encounter for palliative care: Secondary | ICD-10-CM | POA: Diagnosis present

## 2017-08-31 DIAGNOSIS — I50812 Chronic right heart failure: Secondary | ICD-10-CM

## 2017-08-31 DIAGNOSIS — R451 Restlessness and agitation: Secondary | ICD-10-CM | POA: Diagnosis not present

## 2017-08-31 DIAGNOSIS — Z6841 Body Mass Index (BMI) 40.0 and over, adult: Secondary | ICD-10-CM | POA: Diagnosis not present

## 2017-08-31 DIAGNOSIS — K219 Gastro-esophageal reflux disease without esophagitis: Secondary | ICD-10-CM | POA: Diagnosis present

## 2017-08-31 DIAGNOSIS — I071 Rheumatic tricuspid insufficiency: Secondary | ICD-10-CM | POA: Diagnosis present

## 2017-08-31 DIAGNOSIS — I5082 Biventricular heart failure: Secondary | ICD-10-CM | POA: Diagnosis present

## 2017-08-31 DIAGNOSIS — I130 Hypertensive heart and chronic kidney disease with heart failure and stage 1 through stage 4 chronic kidney disease, or unspecified chronic kidney disease: Secondary | ICD-10-CM | POA: Diagnosis present

## 2017-08-31 DIAGNOSIS — I5084 End stage heart failure: Secondary | ICD-10-CM | POA: Diagnosis present

## 2017-08-31 DIAGNOSIS — I480 Paroxysmal atrial fibrillation: Secondary | ICD-10-CM | POA: Diagnosis present

## 2017-08-31 DIAGNOSIS — I5043 Acute on chronic combined systolic (congestive) and diastolic (congestive) heart failure: Secondary | ICD-10-CM | POA: Diagnosis present

## 2017-08-31 DIAGNOSIS — L299 Pruritus, unspecified: Secondary | ICD-10-CM | POA: Diagnosis not present

## 2017-08-31 DIAGNOSIS — I959 Hypotension, unspecified: Secondary | ICD-10-CM | POA: Diagnosis present

## 2017-08-31 DIAGNOSIS — J9601 Acute respiratory failure with hypoxia: Secondary | ICD-10-CM | POA: Diagnosis not present

## 2017-08-31 DIAGNOSIS — N179 Acute kidney failure, unspecified: Secondary | ICD-10-CM

## 2017-08-31 DIAGNOSIS — M7989 Other specified soft tissue disorders: Secondary | ICD-10-CM | POA: Diagnosis present

## 2017-08-31 DIAGNOSIS — E8809 Other disorders of plasma-protein metabolism, not elsewhere classified: Secondary | ICD-10-CM | POA: Diagnosis present

## 2017-08-31 DIAGNOSIS — I482 Chronic atrial fibrillation: Secondary | ICD-10-CM | POA: Diagnosis not present

## 2017-08-31 DIAGNOSIS — E873 Alkalosis: Secondary | ICD-10-CM | POA: Diagnosis present

## 2017-08-31 DIAGNOSIS — R946 Abnormal results of thyroid function studies: Secondary | ICD-10-CM | POA: Diagnosis present

## 2017-08-31 DIAGNOSIS — Z9181 History of falling: Secondary | ICD-10-CM

## 2017-08-31 DIAGNOSIS — J9600 Acute respiratory failure, unspecified whether with hypoxia or hypercapnia: Secondary | ICD-10-CM

## 2017-08-31 DIAGNOSIS — M109 Gout, unspecified: Secondary | ICD-10-CM | POA: Diagnosis present

## 2017-08-31 DIAGNOSIS — J9811 Atelectasis: Secondary | ICD-10-CM | POA: Diagnosis present

## 2017-08-31 DIAGNOSIS — Z95828 Presence of other vascular implants and grafts: Secondary | ICD-10-CM

## 2017-08-31 DIAGNOSIS — Z9049 Acquired absence of other specified parts of digestive tract: Secondary | ICD-10-CM

## 2017-08-31 DIAGNOSIS — R0602 Shortness of breath: Secondary | ICD-10-CM

## 2017-08-31 DIAGNOSIS — I10 Essential (primary) hypertension: Secondary | ICD-10-CM | POA: Diagnosis not present

## 2017-08-31 DIAGNOSIS — E860 Dehydration: Secondary | ICD-10-CM | POA: Diagnosis present

## 2017-08-31 DIAGNOSIS — I878 Other specified disorders of veins: Secondary | ICD-10-CM | POA: Diagnosis present

## 2017-08-31 DIAGNOSIS — F419 Anxiety disorder, unspecified: Secondary | ICD-10-CM | POA: Diagnosis present

## 2017-08-31 DIAGNOSIS — G4733 Obstructive sleep apnea (adult) (pediatric): Secondary | ICD-10-CM | POA: Diagnosis present

## 2017-08-31 DIAGNOSIS — Z90710 Acquired absence of both cervix and uterus: Secondary | ICD-10-CM

## 2017-08-31 DIAGNOSIS — I272 Pulmonary hypertension, unspecified: Secondary | ICD-10-CM | POA: Diagnosis present

## 2017-08-31 DIAGNOSIS — Z66 Do not resuscitate: Secondary | ICD-10-CM | POA: Diagnosis present

## 2017-08-31 DIAGNOSIS — F0390 Unspecified dementia without behavioral disturbance: Secondary | ICD-10-CM | POA: Diagnosis present

## 2017-08-31 DIAGNOSIS — R570 Cardiogenic shock: Secondary | ICD-10-CM | POA: Diagnosis present

## 2017-08-31 DIAGNOSIS — I481 Persistent atrial fibrillation: Secondary | ICD-10-CM | POA: Diagnosis present

## 2017-08-31 DIAGNOSIS — E039 Hypothyroidism, unspecified: Secondary | ICD-10-CM | POA: Diagnosis present

## 2017-08-31 DIAGNOSIS — I5023 Acute on chronic systolic (congestive) heart failure: Secondary | ICD-10-CM

## 2017-08-31 DIAGNOSIS — J45909 Unspecified asthma, uncomplicated: Secondary | ICD-10-CM | POA: Diagnosis present

## 2017-08-31 DIAGNOSIS — E876 Hypokalemia: Secondary | ICD-10-CM | POA: Diagnosis present

## 2017-08-31 DIAGNOSIS — Z888 Allergy status to other drugs, medicaments and biological substances status: Secondary | ICD-10-CM

## 2017-08-31 DIAGNOSIS — Z79899 Other long term (current) drug therapy: Secondary | ICD-10-CM

## 2017-08-31 DIAGNOSIS — Z7982 Long term (current) use of aspirin: Secondary | ICD-10-CM

## 2017-08-31 DIAGNOSIS — Z9981 Dependence on supplemental oxygen: Secondary | ICD-10-CM

## 2017-08-31 DIAGNOSIS — R627 Adult failure to thrive: Secondary | ICD-10-CM | POA: Diagnosis present

## 2017-08-31 DIAGNOSIS — Z882 Allergy status to sulfonamides status: Secondary | ICD-10-CM

## 2017-08-31 DIAGNOSIS — I50810 Right heart failure, unspecified: Secondary | ICD-10-CM | POA: Diagnosis not present

## 2017-08-31 DIAGNOSIS — I361 Nonrheumatic tricuspid (valve) insufficiency: Secondary | ICD-10-CM | POA: Diagnosis not present

## 2017-08-31 DIAGNOSIS — I4891 Unspecified atrial fibrillation: Secondary | ICD-10-CM | POA: Diagnosis present

## 2017-08-31 DIAGNOSIS — I50813 Acute on chronic right heart failure: Secondary | ICD-10-CM | POA: Diagnosis not present

## 2017-08-31 DIAGNOSIS — I9589 Other hypotension: Secondary | ICD-10-CM | POA: Diagnosis not present

## 2017-08-31 DIAGNOSIS — I509 Heart failure, unspecified: Secondary | ICD-10-CM

## 2017-08-31 HISTORY — DX: Heart failure, unspecified: I50.9

## 2017-08-31 HISTORY — DX: Unspecified dementia, unspecified severity, without behavioral disturbance, psychotic disturbance, mood disturbance, and anxiety: F03.90

## 2017-08-31 LAB — HEPATIC FUNCTION PANEL
ALT: 9 U/L — ABNORMAL LOW (ref 14–54)
AST: 25 U/L (ref 15–41)
Albumin: 2.4 g/dL — ABNORMAL LOW (ref 3.5–5.0)
Alkaline Phosphatase: 75 U/L (ref 38–126)
BILIRUBIN DIRECT: 0.4 mg/dL (ref 0.1–0.5)
BILIRUBIN INDIRECT: 0.7 mg/dL (ref 0.3–0.9)
TOTAL PROTEIN: 5.7 g/dL — AB (ref 6.5–8.1)
Total Bilirubin: 1.1 mg/dL (ref 0.3–1.2)

## 2017-08-31 LAB — CBC WITH DIFFERENTIAL/PLATELET
Basophils Absolute: 0 10*3/uL (ref 0.0–0.1)
Basophils Relative: 1 %
EOS ABS: 0.5 10*3/uL (ref 0.0–0.7)
EOS PCT: 9 %
HCT: 28.1 % — ABNORMAL LOW (ref 36.0–46.0)
Hemoglobin: 8.7 g/dL — ABNORMAL LOW (ref 12.0–15.0)
Lymphocytes Relative: 12 %
Lymphs Abs: 0.6 10*3/uL — ABNORMAL LOW (ref 0.7–4.0)
MCH: 27.6 pg (ref 26.0–34.0)
MCHC: 31 g/dL (ref 30.0–36.0)
MCV: 89.2 fL (ref 78.0–100.0)
MONO ABS: 0.7 10*3/uL (ref 0.1–1.0)
Monocytes Relative: 14 %
Neutro Abs: 3.3 10*3/uL (ref 1.7–7.7)
Neutrophils Relative %: 64 %
PLATELETS: 206 10*3/uL (ref 150–400)
RBC: 3.15 MIL/uL — ABNORMAL LOW (ref 3.87–5.11)
RDW: 14.9 % (ref 11.5–15.5)
WBC: 5.1 10*3/uL (ref 4.0–10.5)

## 2017-08-31 LAB — URINALYSIS, ROUTINE W REFLEX MICROSCOPIC
BACTERIA UA: NONE SEEN
Bilirubin Urine: NEGATIVE
GLUCOSE, UA: NEGATIVE mg/dL
Hgb urine dipstick: NEGATIVE
KETONES UR: NEGATIVE mg/dL
NITRITE: NEGATIVE
PROTEIN: 30 mg/dL — AB
Specific Gravity, Urine: 1.009 (ref 1.005–1.030)
pH: 6 (ref 5.0–8.0)

## 2017-08-31 LAB — BASIC METABOLIC PANEL
Anion gap: 9 (ref 5–15)
BUN: 62 mg/dL — AB (ref 6–20)
CALCIUM: 7.9 mg/dL — AB (ref 8.9–10.3)
CO2: 32 mmol/L (ref 22–32)
CREATININE: 1.4 mg/dL — AB (ref 0.44–1.00)
Chloride: 98 mmol/L — ABNORMAL LOW (ref 101–111)
GFR calc Af Amer: 39 mL/min — ABNORMAL LOW (ref >60)
GFR, EST NON AFRICAN AMERICAN: 33 mL/min — AB (ref >60)
Glucose, Bld: 115 mg/dL — ABNORMAL HIGH (ref 65–99)
Potassium: 2.6 mmol/L — CL (ref 3.5–5.1)
SODIUM: 139 mmol/L (ref 135–145)

## 2017-08-31 LAB — BRAIN NATRIURETIC PEPTIDE: B Natriuretic Peptide: 492 pg/mL — ABNORMAL HIGH (ref 0.0–100.0)

## 2017-08-31 LAB — TROPONIN I: Troponin I: <0.03 ng/mL (ref <0.03)

## 2017-08-31 MED ORDER — GUAIFENESIN-DM 100-10 MG/5ML PO SYRP
5.0000 mL | ORAL_SOLUTION | ORAL | Status: DC | PRN
  Administered 2017-09-01 – 2017-09-11 (×10): 5 mL via ORAL
  Filled 2017-08-31 (×14): qty 5

## 2017-08-31 MED ORDER — ONDANSETRON HCL 4 MG/2ML IJ SOLN
4.0000 mg | Freq: Four times a day (QID) | INTRAMUSCULAR | Status: DC | PRN
  Administered 2017-09-01 – 2017-09-07 (×4): 4 mg via INTRAVENOUS
  Filled 2017-08-31 (×4): qty 2

## 2017-08-31 MED ORDER — POTASSIUM CHLORIDE 10 MEQ/100ML IV SOLN
10.0000 meq | INTRAVENOUS | Status: AC
  Administered 2017-08-31 – 2017-09-01 (×4): 10 meq via INTRAVENOUS
  Filled 2017-08-31 (×4): qty 100

## 2017-08-31 MED ORDER — POTASSIUM CHLORIDE 10 MEQ/100ML IV SOLN
10.0000 meq | Freq: Once | INTRAVENOUS | Status: AC
  Administered 2017-08-31: 10 meq via INTRAVENOUS
  Filled 2017-08-31: qty 100

## 2017-08-31 MED ORDER — SODIUM CHLORIDE 0.9 % IV SOLN
INTRAVENOUS | Status: DC
Start: 2017-08-31 — End: 2017-09-01
  Administered 2017-08-31 – 2017-09-01 (×2): via INTRAVENOUS

## 2017-08-31 MED ORDER — ALBUTEROL SULFATE (2.5 MG/3ML) 0.083% IN NEBU: 2.5000 mg | INHALATION_SOLUTION | RESPIRATORY_TRACT | Status: DC | PRN

## 2017-08-31 MED ORDER — LUBIPROSTONE 8 MCG PO CAPS
8.0000 ug | ORAL_CAPSULE | Freq: Two times a day (BID) | ORAL | Status: DC
  Administered 2017-09-01 (×2): 8 ug via ORAL
  Filled 2017-08-31 (×7): qty 1

## 2017-08-31 MED ORDER — FAMOTIDINE 20 MG PO TABS
20.0000 mg | ORAL_TABLET | Freq: Every day | ORAL | Status: DC
  Administered 2017-08-31 – 2017-09-01 (×2): 20 mg via ORAL
  Filled 2017-08-31 (×2): qty 1

## 2017-08-31 MED ORDER — MONTELUKAST SODIUM 10 MG PO TABS
10.0000 mg | ORAL_TABLET | Freq: Every evening | ORAL | Status: DC
  Administered 2017-09-01: 10 mg via ORAL
  Filled 2017-08-31: qty 1

## 2017-08-31 MED ORDER — POTASSIUM CHLORIDE CRYS ER 20 MEQ PO TBCR
40.0000 meq | EXTENDED_RELEASE_TABLET | Freq: Three times a day (TID) | ORAL | Status: DC
  Administered 2017-08-31 – 2017-09-01 (×4): 40 meq via ORAL
  Filled 2017-08-31 (×5): qty 2

## 2017-08-31 MED ORDER — ACETAMINOPHEN 650 MG RE SUPP: 650.0000 mg | Freq: Four times a day (QID) | RECTAL | Status: DC | PRN

## 2017-08-31 MED ORDER — MAGNESIUM SULFATE 2 GM/50ML IV SOLN
2.0000 g | INTRAVENOUS | Status: AC
  Administered 2017-08-31: 2 g via INTRAVENOUS
  Filled 2017-08-31: qty 50

## 2017-08-31 MED ORDER — ENSURE ENLIVE PO LIQD
237.0000 mL | Freq: Two times a day (BID) | ORAL | Status: DC
  Administered 2017-08-31 – 2017-09-12 (×17): 237 mL via ORAL

## 2017-08-31 MED ORDER — OXYBUTYNIN CHLORIDE 5 MG PO TABS
5.0000 mg | ORAL_TABLET | Freq: Every day | ORAL | Status: DC
  Administered 2017-09-01: 5 mg via ORAL
  Filled 2017-08-31 (×2): qty 1

## 2017-08-31 MED ORDER — FERROUS FUMARATE 324 (106 FE) MG PO TABS
1.0000 | ORAL_TABLET | Freq: Every evening | ORAL | Status: DC
  Administered 2017-09-01: 106 mg via ORAL
  Filled 2017-08-31: qty 1

## 2017-08-31 MED ORDER — DONEPEZIL HCL 5 MG PO TABS
5.0000 mg | ORAL_TABLET | Freq: Every morning | ORAL | Status: DC
  Administered 2017-09-01: 5 mg via ORAL
  Filled 2017-08-31 (×2): qty 1

## 2017-08-31 MED ORDER — LORATADINE 10 MG PO TABS
10.0000 mg | ORAL_TABLET | Freq: Every day | ORAL | Status: DC
  Administered 2017-09-01: 10 mg via ORAL
  Filled 2017-08-31 (×2): qty 1

## 2017-08-31 MED ORDER — POTASSIUM CHLORIDE 10 MEQ/100ML IV SOLN
INTRAVENOUS | Status: AC
  Administered 2017-09-01: 10 meq
  Filled 2017-08-31: qty 100

## 2017-08-31 MED ORDER — ACETAMINOPHEN 325 MG PO TABS
650.0000 mg | ORAL_TABLET | Freq: Four times a day (QID) | ORAL | Status: DC | PRN
  Administered 2017-09-01 – 2017-09-07 (×10): 650 mg via ORAL
  Filled 2017-08-31 (×11): qty 2

## 2017-08-31 MED ORDER — ONDANSETRON HCL 4 MG PO TABS: 4.0000 mg | ORAL_TABLET | Freq: Four times a day (QID) | ORAL | Status: DC | PRN

## 2017-08-31 MED ORDER — ENOXAPARIN SODIUM 60 MG/0.6ML ~~LOC~~ SOLN
60.0000 mg | SUBCUTANEOUS | Status: DC
  Administered 2017-08-31 – 2017-09-01 (×2): 60 mg via SUBCUTANEOUS
  Filled 2017-08-31 (×2): qty 0.6

## 2017-08-31 MED ORDER — ASPIRIN EC 81 MG PO TBEC
81.0000 mg | DELAYED_RELEASE_TABLET | Freq: Every day | ORAL | Status: DC
  Administered 2017-09-01 – 2017-09-07 (×6): 81 mg via ORAL
  Filled 2017-08-31 (×7): qty 1

## 2017-08-31 MED ORDER — DILTIAZEM HCL ER COATED BEADS 120 MG PO CP24
120.0000 mg | ORAL_CAPSULE | Freq: Every day | ORAL | Status: DC
  Administered 2017-09-01: 120 mg via ORAL
  Filled 2017-08-31 (×2): qty 1

## 2017-08-31 NOTE — H&P (Addendum)
History and Physical  Emily [name]:[id number] DOB: 03/29/1931 DOA: 08/31/2017  Referring physician: Dr Hyacinth Meeker, ED physician PCP: Kirstie Peri, MD  Outpatient Specialists: Dr Purvis Sheffield  Patient Coming From: home  Chief Complaint: leg swelling, weight increase, sent by cardiology  HPI: Emily [name] is a 84 y.o. female with a history of dementia, paroxysmal atrial fibrillation not currently on anticoagulation due to side effects of Xarelto, right-sided CHF with systolic function between 40 to 45% on echocardiogram 2017, GERD, hypertension.  Patient is accompanied by her daughter who provides history as the patient has dementia.  Patient recently changed cardiologists and is started seeing Dr. Purvis Sheffield.  She has been struggling with diuresis over the past several weeks and has been using Lasix, torsemide, Zaroxolyn.  She has gained proximately 30 pounds over the past several weeks with increased leg swelling and woody edema.  She has become increasingly weak and talked with cardiology today, who sent her here for evaluation.  No palliating or provoking factors.  Symptoms are worsening.  Emergency Department Course: BNP slightly elevated at 400.  Chest x-ray shows mild CHF.  Patient is hypotensive with blood pressure in the mid 80s over 40s.  Her potassium is 2.6, creatinine 1.4 (baseline 0.5-1 in 2017), BUN is 62.  Additionally, her protein is 5.7 and albumin is 2.4.  Review of Systems:   Pt denies any fevers, chills, nausea, vomiting, diarrhea, constipation, abdominal pain, shortness of breath, dyspnea on exertion, orthopnea, cough, wheezing, palpitations, headache, vision changes, lightheadedness, dizziness, melena, rectal bleeding.  Review of systems are otherwise negative  Past Medical History:  Diagnosis Date  . A-fib (HCC)   . CHF (congestive heart failure) (HCC)   . GERD (gastroesophageal reflux disease)   . HTN (hypertension)   . Hypertension    Past Surgical History:    Procedure Laterality Date  . ABDOMINAL HYSTERECTOMY    . BRONCHOSCOPY  2017   aspirated what was felt to be iron tablet, evaluated at [facility] with bronchs  . CESAREAN SECTION    . CHOLECYSTECTOMY    . HERNIA REPAIR     Social History:  reports that she has never smoked. She has never used smokeless tobacco. She reports that she does not drink alcohol or use drugs. Patient lives at home  Allergies  Allergen Reactions  . Sulfa Antibiotics Rash  . Quinine Derivatives Other (See Comments)    Bleeding from mouth. Affteced bone marrow. Decreased WBC.    Family History  Problem Relation Age of Onset  . Colon cancer Neg Hx   . Colon polyps Neg Hx      Prior to Admission medications   Medication Sig Start Date End Date Taking? Authorizing Provider  albuterol (PROVENTIL) (2.5 MG/3ML) 0.083% nebulizer solution USE ONE VIAL IN NEBULIZER FOUR TIMES DAILY AS NEEDED FOR SHORTNESS OF BREATH/WHEEZING 08/18/17  Yes [provider]  aspirin EC 81 MG tablet Take 81 mg by mouth daily.   Yes [provider]  cetirizine (ZYRTEC) 10 MG tablet Take 10 mg by mouth daily.   Yes [provider]  diltiazem (CARDIZEM CD) 120 MG 24 hr capsule Take 120 mg by mouth 2 (two) times daily.   Yes [provider]  donepezil (ARICEPT) 5 MG tablet Take 5 mg by mouth every morning.    Yes [provider]  famotidine (PEPCID) 20 MG tablet Take 20 mg by mouth at bedtime.   Yes [provider]  Ferrous Fumarate (HEMOCYTE - 106 MG FE)  324 (106 Fe) MG TABS tablet Take 1 tablet by mouth every evening.    Yes [provider]  hydrocortisone (ANUSOL-HC) 2.5 % rectal cream Place 1 application rectally 2 (two) times daily. Patient taking differently: Place 1 application rectally 2 (two) times daily as needed for hemorrhoids or anal itching.  05/28/17  Yes Gelene Mink, NP  hydrocortisone (ANUSOL-HC) 25 MG suppository Place 1 suppository (25 mg total) rectally every 12  (twelve) hours. Patient taking differently: Place 25 mg rectally 2 (two) times daily as needed for hemorrhoids or anal itching.  07/31/17  Yes Gelene Mink, NP  lubiprostone (AMITIZA) 8 MCG capsule Take 8 mcg by mouth 2 (two) times daily with a meal.    Yes [provider]  metolazone (ZAROXOLYN) 2.5 MG tablet Take 2.5 mg by mouth every morning.   Yes [provider]  metoprolol (LOPRESSOR) 50 MG tablet Take 50 mg by mouth 2 (two) times daily.   Yes [provider]  montelukast (SINGULAIR) 10 MG tablet Take 10 mg by mouth every evening.   Yes [provider]  Multiple Vitamin (MULTIVITAMIN) tablet Take 1 tablet by mouth daily.   Yes [provider]  oxybutynin (DITROPAN) 5 MG tablet Take 5 mg by mouth daily.   Yes [provider]  potassium chloride SA (K-DUR,KLOR-CON) 20 MEQ tablet Take 10 mEq by mouth 2 (two) times daily.   Yes [provider]  torsemide (DEMADEX) 100 MG tablet Take 100 mg by mouth every morning.    Yes [provider]    Physical Exam: BP (!) 87/40 (BP Location: Left Arm)   Pulse 61   Temp 97.9 F (36.6 C) (Oral)   Resp 18   Ht 5\' 4"  (1.626 m)   Wt 117.9 kg (260 lb)   SpO2 92%   BMI 44.63 kg/m   . General: Elderly Caucasian female. Awake and alert and oriented x3. No acute cardiopulmonary distress.  Marland Kitchen HEENT: Normocephalic atraumatic.  Right and left ears normal in appearance.  Pupils equal, round, reactive to light. Extraocular muscles are intact. Sclerae anicteric and noninjected.  Moist mucosal membranes. No mucosal lesions.  . Neck: Neck supple without lymphadenopathy. No carotid bruits. No masses palpated.  . Cardiovascular: Regular rate with normal S1-S2 sounds. No murmurs, rubs, gallops auscultated.  Minimal JVD.  4+ woody edema with chronic venous stasis changes bilaterally. Marland Kitchen Respiratory: Good respiratory effort with no wheezes, rales, rhonchi. Lungs clear to auscultation bilaterally.  No  accessory muscle use. . Abdomen: Soft, nontender, nondistended. Active bowel sounds. No masses or hepatosplenomegaly  . Skin: No rashes, lesions, or ulcerations.  Dry, warm to touch. 2+ dorsalis pedis and radial pulses. . Musculoskeletal: No calf or leg pain. All major joints not erythematous nontender.  No upper or lower joint deformation.  Good ROM.  No contractures  . Psychiatric: Intact judgment and insight. Pleasant and cooperative. . Neurologic: No focal neurological deficits. Strength is 5/5 and symmetric in upper and lower extremities.  Cranial nerves II through XII are grossly intact.           Labs on Admission: I have personally reviewed following labs and imaging studies  CBC: Recent Labs  Lab 08/31/17 1530  WBC 5.1  NEUTROABS 3.3  HGB 8.7*  HCT 28.1*  MCV 89.2  PLT 206   Basic Metabolic Panel: Recent Labs  Lab 08/31/17 1530  NA 139  K 2.6*  CL 98*  CO2 32  GLUCOSE 115*  BUN 62*  CREATININE 1.40*  CALCIUM 7.9*   GFR: Estimated Creatinine Clearance: 37.8 mL/min (A) (by C-G formula based on SCr of 1.4 mg/dL (H)). Liver Function Tests: Recent Labs  Lab 08/31/17 1530  AST 25  ALT 9*  ALKPHOS 75  BILITOT 1.1  PROT 5.7*  ALBUMIN 2.4*   No results for input(s): LIPASE, AMYLASE in the last 168 hours. No results for input(s): AMMONIA in the last 168 hours. Coagulation Profile: No results for input(s): INR, PROTIME in the last 168 hours. Cardiac Enzymes: Recent Labs  Lab 08/31/17 1537  TROPONINI <0.03   BNP (last 3 results) No results for input(s): PROBNP in the last 8760 hours. HbA1C: No results for input(s): HGBA1C in the last 72 hours. CBG: No results for input(s): GLUCAP in the last 168 hours. Lipid Profile: No results for input(s): CHOL, HDL, LDLCALC, TRIG, CHOLHDL, LDLDIRECT in the last 72 hours. Thyroid Function Tests: No results for input(s): TSH, T4TOTAL, FREET4, T3FREE, THYROIDAB in the last 72 hours. Anemia Panel: No results for  input(s): VITAMINB12, FOLATE, FERRITIN, TIBC, IRON, RETICCTPCT in the last 72 hours. Urine analysis:    Component Value Date/Time   COLORURINE YELLOW 10/16/2015 1145   APPEARANCEUR CLEAR 10/16/2015 1145   LABSPEC 1.025 10/16/2015 1145   PHURINE 6.5 10/16/2015 1145   GLUCOSEU NEGATIVE 10/16/2015 1145   HGBUR MODERATE (A) 10/16/2015 1145   BILIRUBINUR NEGATIVE 10/16/2015 1145   KETONESUR NEGATIVE 10/16/2015 1145   PROTEINUR 100 (A) 10/16/2015 1145   NITRITE NEGATIVE 10/16/2015 1145   LEUKOCYTESUR NEGATIVE 10/16/2015 1145   Sepsis Labs: @LABRCNTIP (procalcitonin:4,lacticidven:4) )No results found for this or any previous visit (from the past 240 hour(s)).   Radiological Exams on Admission: Dg Chest Portable 1 View  Result Date: 08/31/2017 CLINICAL DATA:  84 year old female with history of swelling in the legs. Weight gain over the past 2 weeks. EXAM: PORTABLE CHEST 1 VIEW COMPARISON:  Chest x-ray 07/25/2017. FINDINGS: There is cephalization of the pulmonary vasculature and slight indistinctness of the interstitial markings suggestive of mild pulmonary edema. Small bilateral pleural effusions. Mild cardiomegaly. Aortic atherosclerosis. IMPRESSION: 1. The appearance the chest suggests mild congestive heart failure. 2. Aortic atherosclerosis. Electronically Signed   By: Trudie Reed M.D.   On: 08/31/2017 16:20    EKG: Independently reviewed.  Atrial fibrillation with no ST changes  Assessment/Plan: Principal Problem:   Hypotension Active Problems:   Hypertension   CHF (congestive heart failure) (HCC)   GERD (gastroesophageal reflux disease)   A-fib (HCC)   Hypokalemia   Hypoalbuminemia    This patient was discussed with the ED physician, including pertinent vitals, physical exam findings, labs, and imaging.  We also discussed care given by the ED provider.  1. Hypotension a. Patient intravascularly dry despite being fluid overloaded in the periphery b. We will fluid  resuscitate and hold patient's diuretics and metoprolol c. Repeat BMP tomorrow morning 2. Hypokalemia a. Will replace potassium 3. Hypoalbuminemia a. Question of whether this is increased loss through the urine versus decreased production in the liver due to CHF.  Her LFTs are normal b. This may be very difficult to treat, but is the likely etiology of the patient's peripheral edema c. Nutrition consult d. Start Ensure  supplementation 4. CHF a. We will get echocardiogram tomorrow 5. A. Fib a. Hold metoprolol, continue Cardizem b. Patient not currently on anticoagulation due to side effects 6. Hypertension a. Hold anti-hypertensives for the moment (will continue Cardizem as to prevent A. fib with RVR) 7. GERD a. Continue Protonix  DVT prophylaxis: lovenox Consultants: none Code Status: DNR Family Communication: Daughter Disposition Plan: Patient should be able to return home   Levie HeritageStinson, Ladavia Lindenbaum J, [place]DO [facility] Pager [phone]  If 7PM-7AM, please contact night-coverage [email or web address] Password [email or web address]

## 2017-08-31 NOTE — ED Provider Notes (Signed)
[facility] EMERGENCY DEPARTMENT Provider Note   CSN: [id number] Arrival date & time: 08/31/17  1520     History   Chief Complaint Chief Complaint  Patient presents with  . Leg Swelling    HPI Emily [name] is a 84 y.o. female.  HPI  The patient is an 84 year old female who has a known history of congestive heart failure as well as atrial fibrillation hypertension and chronic peripheral edema.  She is accompanied by her daughter who is an additional historian stating that she has had chronic problems with swelling, her baseline weight is 230 pounds but she has been as high as 260 pounds recently.  They have been struggling with diuresis in the past and have used medications such as Lasix, torsemide and Zaroxolyn, the latter to which she currently takes.  She is also taking frequent potassium supplementation because of the medications that caused her to be hypokalemic.  Recently the patient started to have increasing swelling, increasing weakness and thus a phone call with her doctors and her cardiologist yesterday ended up with recommendations from her cardiologist to come to the hospital and potentially be admitted to the hospital for IV diuresis.  The patient does endorse some shortness of breath, occasional cough, no fevers or chills.  Her legs are chronically swollen, chronically erythematous and the daughter who is here and is her primary caregiver states that this is no change from usual, just the increased swelling.  The patient also endorses pain in her feet when she tries to ambulate.  She does ambulate a couple of times a day, this is minimal, with a walker.  She also uses oxygen as needed during the day when her oxygen dropped to 91% and at night.  Symptoms are gradually worsening and have now become severe.  Review of the medical record shows that the patient had a most recent echocardiogram was performed in March 2017 based on prior cardiology notes showing an ejection fraction  of 45 to 50% with concentric left ventricular hypertrophy, diffuse hypokinesis and some atrial dilatation.  There was some aortic and tricuspid regurgitation.  Evidently the patient's cardiologist was originally in [place], they transferred to [place] and the patient is now started care with local cardiology and Dr. Purvis Sheffield.  She has not yet had an echocardiogram here but was scheduled for one in 48 hours  Past Medical History:  Diagnosis Date  . A-fib (HCC)   . CHF (congestive heart failure) (HCC)   . GERD (gastroesophageal reflux disease)   . HTN (hypertension)   . Hypertension     Patient Active Problem List   Diagnosis Date Noted  . Hemorrhoids 05/28/2017    Past Surgical History:  Procedure Laterality Date  . ABDOMINAL HYSTERECTOMY    . BRONCHOSCOPY  2017   aspirated what was felt to be iron tablet, evaluated at [facility] with bronchs  . CESAREAN SECTION    . CHOLECYSTECTOMY    . HERNIA REPAIR       OB History   None      Home Medications    Prior to Admission medications   Medication Sig Start Date End Date Taking? Authorizing Provider  aspirin EC 81 MG tablet Take 81 mg by mouth daily.    [provider]  cetirizine (ZYRTEC) 10 MG tablet Take 10 mg by mouth daily.    [provider]  diltiazem (CARDIZEM) 120 MG tablet Take 120 mg by mouth 2 (two) times daily.     [provider]  donepezil (ARICEPT) 5 MG tablet Take 5 mg by mouth at bedtime.    [provider]  famotidine (PEPCID) 20 MG tablet Take 20 mg by mouth at bedtime.    [provider]  Ferrous Fumarate (HEMOCYTE - 106 MG FE) 324 (106 Fe) MG TABS tablet Take 1 tablet by mouth daily.     [provider]  hydrocortisone (ANUSOL-HC) 2.5 % rectal cream Place 1 application rectally 2 (two) times daily. 05/28/17   Gelene Mink, NP  hydrocortisone (ANUSOL-HC) 25 MG suppository Place 1 suppository (25 mg total) rectally every 12 (twelve) hours. 07/31/17    Gelene Mink, NP  lubiprostone (AMITIZA) 24 MCG capsule Take 24 mcg by mouth daily with breakfast.    [provider]  metoprolol (LOPRESSOR) 50 MG tablet Take 50 mg by mouth 2 (two) times daily.    [provider]  montelukast (SINGULAIR) 10 MG tablet Take 10 mg by mouth every evening.    [provider]  Multiple Vitamin (MULTIVITAMIN) tablet Take 1 tablet by mouth daily.    [provider]  NON FORMULARY Potassium 20 meq   Once a day    [provider]  oxybutynin (DITROPAN) 5 MG tablet Take 5 mg by mouth daily.    [provider]  TORSEMIDE PO Take 100 mg by mouth daily.     [provider]    Family History Family History  Problem Relation Age of Onset  . Colon cancer Neg Hx   . Colon polyps Neg Hx     Social History Social History   Tobacco Use  . Smoking status: Never Smoker  . Smokeless tobacco: Never Used  Substance Use Topics  . Alcohol use: No  . Drug use: No     Allergies   Sulfa antibiotics and Quinine derivatives   Review of Systems Review of Systems  All other systems reviewed and are negative.    Physical Exam Updated Vital Signs BP (!) 87/40 (BP Location: Left Arm)   Pulse 61   Temp 97.9 F (36.6 C) (Oral)   Resp 18   Ht 5\' 4"  (1.626 m)   Wt 117.9 kg (260 lb)   SpO2 92%   BMI 44.63 kg/m   Physical Exam  Constitutional: She appears well-developed and well-nourished. No distress.  HENT:  Head: Normocephalic and atraumatic.  Mouth/Throat: No oropharyngeal exudate.  Mild dehdyradtion to MM  Eyes: Pupils are equal, round, and reactive to light. Conjunctivae and EOM are normal. Right eye exhibits no discharge. Left eye exhibits no discharge. No scleral icterus.  Neck: Normal range of motion. Neck supple. No JVD present. No thyromegaly present.  Cardiovascular: Normal rate, regular rhythm, normal heart sounds and intact distal pulses. Exam reveals no gallop and no friction rub.  No  murmur heard. Pulmonary/Chest: Effort normal. No respiratory distress. She has no wheezes. She has rales ( rales at the bases bilaterally, no increased work of breathing).  Abdominal: Soft. Bowel sounds are normal. She exhibits no distension and no mass. There is no tenderness.  No ana sarca, no ttp or masses  Musculoskeletal: Normal range of motion. She exhibits edema (severe pitting edema and severe symmetrical edema to LE's below the knees). She exhibits no tenderness.  Lymphadenopathy:    She has no cervical adenopathy.  Neurological: She is alert. Coordination normal.  Which is clear, coordination is normal, the patient is generally weak but able to sit up in the bed with assistance.  No cranial nerve deficits seen on exam 3 through 12  Skin: Skin is warm and dry. No rash noted. No erythema.  Erythema noted over the edematous areas of the legs bilaterally which the daughter states is chronic  Psychiatric: She has a normal mood and affect. Her behavior is normal.  Nursing note and vitals reviewed.    ED Treatments / Results  Labs (all labs ordered are listed, but only abnormal results are displayed) Labs Reviewed  CBC WITH DIFFERENTIAL/PLATELET - Abnormal; Notable for the following components:      Result Value   RBC 3.15 (*)    Hemoglobin 8.7 (*)    HCT 28.1 (*)    Lymphs Abs 0.6 (*)    All other components within normal limits  BASIC METABOLIC PANEL - Abnormal; Notable for the following components:   Potassium 2.6 (*)    Chloride 98 (*)    Glucose, Bld 115 (*)    BUN 62 (*)    Creatinine, Ser 1.40 (*)    Calcium 7.9 (*)    GFR calc non Af Amer 33 (*)    GFR calc Af Amer 39 (*)    All other components within normal limits  BRAIN NATRIURETIC PEPTIDE - Abnormal; Notable for the following components:   B Natriuretic Peptide 492.0 (*)    All other components within normal limits  TROPONIN I  URINALYSIS, ROUTINE W REFLEX MICROSCOPIC    EKG EKG  Interpretation  Date/Time:  Saturday August 31 2017 15:33:30 EDT Ventricular Rate:  68 PR Interval:    QRS Duration: 117 QT Interval:  501 QTC Calculation: 533 R Axis:   108 Text Interpretation:  Atrial fibrillation Ventricular premature complex Incomplete right bundle branch block Since last tracing rate slower Confirmed by Eber HongMiller, Kyair Ditommaso ([id number]) on 08/31/2017 3:36:06 PM   Radiology Dg Chest Portable 1 View  Result Date: 08/31/2017 CLINICAL DATA:  84 year old female with history of swelling in the legs. Weight gain over the past 2 weeks. EXAM: PORTABLE CHEST 1 VIEW COMPARISON:  Chest x-ray 07/25/2017. FINDINGS: There is cephalization of the pulmonary vasculature and slight indistinctness of the interstitial markings suggestive of mild pulmonary edema. Small bilateral pleural effusions. Mild cardiomegaly. Aortic atherosclerosis. IMPRESSION: 1. The appearance the chest suggests mild congestive heart failure. 2. Aortic atherosclerosis. Electronically Signed   By: Trudie Reedaniel  Entrikin M.D.   On: 08/31/2017 16:20    Procedures .Critical Care Performed by: Eber HongMiller, Uma Jerde, MD Authorized by: Eber HongMiller, Raygan Skarda, MD   Critical care provider statement:    Critical care time (minutes):  35   Critical care time was exclusive of:  Separately billable procedures and treating other patients and teaching time   Critical care was necessary to treat or prevent imminent or life-threatening deterioration of the following conditions:  Renal failure, dehydration and cardiac failure   Critical care was time spent personally by me on the following activities:  Blood draw for specimens, development of treatment plan with patient or surrogate, discussions with consultants, evaluation of patient's response to treatment, examination of patient, obtaining history from patient or surrogate, ordering and performing treatments and interventions, ordering and review of laboratory studies, ordering and review of radiographic studies,  pulse oximetry, re-evaluation of patient's condition and review of old charts   (including critical care time)  Medications Ordered in ED Medications  magnesium sulfate IVPB 2 g 50 mL (has no administration in time range)  potassium chloride 10 mEq in 100 mL IVPB (has no administration in time range)  0.9 %  sodium chloride infusion (has no administration in time range)     Initial Impression / Assessment and Plan / ED Course  I have reviewed the triage vital signs and the nursing notes.  Pertinent labs & imaging results that were available during my care of the patient were reviewed by me and considered in my medical decision making (see chart for details).  Clinical Course as of Sep 01 1639  Sat Aug 31, 2017  1630 Of note there are multiple abnormalities on the lab work including anemia with a hemoglobin of 8.7, creatinine of 1.4 and a BUN of 62 which actually suggest a prerenal azotemia likely secondary to being hypovolemic.  She is hypokalemic with a potassium of 2.6, she will receive some IV fluids, potassium and magnesium, she will need to be admitted to the hospital for further evaluation as well.   [BM]    Clinical Course User Index [BM] Eber Hong, MD    The patient has chronic venous stasis, chronic edema, there is no acute findings, this is all chronic condition.  She does have significant increase in weight, she is approximately 30 pounds up from her baseline, at this time she will likely need to have IV diuresis however given her potassium supplementation and chronic diuretic use she may also have some electrolyte abnormality's.  She is also hypertensive raising concern that she is actually hypovolemic and edematous, this is very complicated and something and will likely need inpatient treatment and cardiology consultation with echocardiogram.  The patient actually has multiple lab abnormalities which make her very difficult to treat with a combination of hypotension,  dehydration and yet being fluid overloaded and congestive heart failure as is evidenced by pulmonary edema on the x-ray.  I have discussed her care with the hospitalist Dr. Adrian Blackwater who will admit the patient to the hospital.  I appreciate his willingness to care for this very pleasant but at the same time medically complicated patient.  Final Clinical Impressions(s) / ED Diagnoses   Final diagnoses:  Acute on chronic systolic congestive heart failure (HCC)  Hypokalemia  AKI (acute kidney injury) (HCC)  Dehydration      Eber Hong, MD 08/31/17 [id number]

## 2017-08-31 NOTE — ED Triage Notes (Addendum)
Ems reports pt's daughter called ems because pt has swelling in legs and has been gaining weight over the past 2 weeks.  Reports went to pcp office yesterday and pcp scheduled echo for Monday.   Pt denies any sob.  EMS says family reports pt's legs are always swollen and weeping but has gotten worse recently.    C/O pain feet.

## 2017-08-31 NOTE — ED Notes (Signed)
Date and time results received: 08/31/17 1628 (use smartphrase ".now" to insert current time)  Test: potassium Critical Value: 2.6  Name of Provider Notified: BM  Orders Received? Or Actions Taken?: yes

## 2017-09-01 ENCOUNTER — Inpatient Hospital Stay (HOSPITAL_COMMUNITY): Payer: Medicare Other

## 2017-09-01 DIAGNOSIS — I361 Nonrheumatic tricuspid (valve) insufficiency: Secondary | ICD-10-CM

## 2017-09-01 LAB — MAGNESIUM: MAGNESIUM: 2.3 mg/dL (ref 1.7–2.4)

## 2017-09-01 LAB — ECHOCARDIOGRAM COMPLETE
HEIGHTINCHES: 64 in
Weight: 4289.27 oz

## 2017-09-01 LAB — BASIC METABOLIC PANEL
ANION GAP: 9 (ref 5–15)
BUN: 60 mg/dL — ABNORMAL HIGH (ref 6–20)
CALCIUM: 7.9 mg/dL — AB (ref 8.9–10.3)
CO2: 33 mmol/L — ABNORMAL HIGH (ref 22–32)
Chloride: 100 mmol/L — ABNORMAL LOW (ref 101–111)
Creatinine, Ser: 1.4 mg/dL — ABNORMAL HIGH (ref 0.44–1.00)
GFR, EST AFRICAN AMERICAN: 39 mL/min — AB (ref >60)
GFR, EST NON AFRICAN AMERICAN: 33 mL/min — AB (ref >60)
Glucose, Bld: 98 mg/dL (ref 65–99)
POTASSIUM: 2.5 mmol/L — AB (ref 3.5–5.1)
SODIUM: 142 mmol/L (ref 135–145)

## 2017-09-01 LAB — MRSA PCR SCREENING: MRSA BY PCR: NEGATIVE

## 2017-09-01 MED ORDER — ALBUMIN HUMAN 25 % IV SOLN
25.0000 g | Freq: Two times a day (BID) | INTRAVENOUS | Status: DC
  Administered 2017-09-01 – 2017-09-02 (×3): 25 g via INTRAVENOUS
  Filled 2017-09-01 (×3): qty 50
  Filled 2017-09-01: qty 100

## 2017-09-01 MED ORDER — METOPROLOL TARTRATE 5 MG/5ML IV SOLN
2.5000 mg | Freq: Once | INTRAVENOUS | Status: AC
  Administered 2017-09-01: 2.5 mg via INTRAVENOUS
  Filled 2017-09-01: qty 5

## 2017-09-01 MED ORDER — TRAZODONE HCL 50 MG PO TABS
50.0000 mg | ORAL_TABLET | Freq: Once | ORAL | Status: AC
  Administered 2017-09-01: 50 mg via ORAL
  Filled 2017-09-01: qty 1

## 2017-09-01 MED ORDER — FUROSEMIDE 10 MG/ML IJ SOLN
60.0000 mg | Freq: Two times a day (BID) | INTRAMUSCULAR | Status: DC
  Administered 2017-09-01 – 2017-09-02 (×3): 60 mg via INTRAVENOUS
  Filled 2017-09-01 (×3): qty 6

## 2017-09-01 MED ORDER — ALBUMIN HUMAN 25 % IV SOLN
INTRAVENOUS | Status: AC
  Filled 2017-09-01: qty 50

## 2017-09-01 MED ORDER — IPRATROPIUM-ALBUTEROL 0.5-2.5 (3) MG/3ML IN SOLN
3.0000 mL | Freq: Four times a day (QID) | RESPIRATORY_TRACT | Status: DC
  Administered 2017-09-01 – 2017-09-02 (×4): 3 mL via RESPIRATORY_TRACT
  Filled 2017-09-01 (×3): qty 3

## 2017-09-01 MED ORDER — POTASSIUM CHLORIDE 10 MEQ/100ML IV SOLN
10.0000 meq | INTRAVENOUS | Status: AC
  Administered 2017-09-01 (×4): 10 meq via INTRAVENOUS
  Filled 2017-09-01 (×4): qty 100

## 2017-09-01 NOTE — Progress Notes (Signed)
*  PRELIMINARY RESULTS* Echocardiogram 2D Echocardiogram has been performed.  Jeryl Columbialliott, Tahir Blank 09/01/2017, 8:31 AM

## 2017-09-01 NOTE — Progress Notes (Signed)
PROGRESS NOTE    Emily [name]  ZOX:[id number] DOB: 04-23-1931 DOA: 08/31/2017 PCP: Kirstie Peri, MD    Brief Narrative:  84 year old female with a history of systolic heart failure, admitted with worsening volume overload and hypotension.  Started on IV fluids.  Blood pressure has improved.  She has hypoalbuminemia which may be contributing to anasarca.  Started on albumin infusion in addition to Lasix.   Assessment & Plan:   Principal Problem:   Hypotension Active Problems:   Hypertension   CHF (congestive heart failure) (HCC)   GERD (gastroesophageal reflux disease)   A-fib (HCC)   Hypokalemia   Hypoalbuminemia   1. Acute on chronic systolic congestive heart failure.  Per cardiology notes, last echocardiogram from 05/2015 indicated ejection fraction of 45 to 50%.  She has had worsening edema and has not responded to oral diuretics.  She was noted to be hypotensive on arrival.  She is been started on IV fluids.  Blood pressures better.  Will restart on IV Lasix as well as IV albumin.  Her  hypoalbuminemia is likely contributing to volume overload.  Monitor intake and output.  Repeat echocardiogram has been performed with report pending.  Will consult cardiology in a.m. 2. Persistent atrial fibrillation.  Not on anticoagulation due to history of anemia.  Heart rate is currently controlled.  She is on diltiazem.  Metoprolol currently on hold due to low blood pressures. 3. She and.  Blood pressure was low on admission.  Metoprolol currently on hold.  Continued on diltiazem. 4. GERD.  Continue Protonix 5. Hypoalbuminemia.  Likely contributing to anasarca.  Nutrition consult.  We will add albumin infusions to help with diuresis. 6. Hypokalemia.  Magnesium level in normal range.  Will replace potassium.   DVT prophylaxis: Lovenox Code Status: DNR Family Communication: Discussed with daughter at the bedside Disposition Plan: Pending hospital course, discharge home once  improved   Consultants:     Procedures:     Antimicrobials:       Subjective: Did not sleep well overnight.  Still feels short of breath.  She is wheezing.  Objective: Vitals:   09/01/17 0430 09/01/17 0500 09/01/17 0700 09/01/17 0802  BP:  101/61 131/82 114/65  Pulse:  67 82 74  Resp:  15 14 15   Temp: (!) 97.4 F (36.3 C)   (!) 97.4 F (36.3 C)  TempSrc: Oral   Oral  SpO2:  96% 96% 97%  Weight:  121.6 kg (268 lb 1.3 oz)    Height:        Intake/Output Summary (Last 24 hours) at 09/01/2017 0859 Last data filed at 09/01/2017 0536 Gross per 24 hour  Intake 1220 ml  Output 500 ml  Net 720 ml   Filed Weights   08/31/17 1535 08/31/17 2125 09/01/17 0500  Weight: 117.9 kg (260 lb) 120.2 kg (264 lb 15.9 oz) 121.6 kg (268 lb 1.3 oz)    Examination:  General exam: Appears calm and comfortable  Respiratory system: Bilateral wheezing, mild increased respiratory effort Cardiovascular system: S1 & S2 heard, RRR. No JVD, murmurs, rubs, gallops or clicks.  2-3+ brawny pedal edema Gastrointestinal system: Abdomen is nondistended, soft and nontender. No organomegaly or masses felt. Normal bowel sounds heard. Central nervous system: Alert and oriented. No focal neurological deficits. Extremities: Symmetric 5 x 5 power. Skin: Erythema of lower extremities bilaterally Psychiatry: Judgement and insight appear normal. Mood & affect appropriate.     Data Reviewed: I have personally reviewed following labs and imaging studies  CBC: Recent Labs  Lab 08/31/17 1530  WBC 5.1  NEUTROABS 3.3  HGB 8.7*  HCT 28.1*  MCV 89.2  PLT 206   Basic Metabolic Panel: Recent Labs  Lab 08/31/17 1530 09/01/17 0456  NA 139 142  K 2.6* 2.5*  CL 98* 100*  CO2 32 33*  GLUCOSE 115* 98  BUN 62* 60*  CREATININE 1.40* 1.40*  CALCIUM 7.9* 7.9*  MG  --  2.3   GFR: Estimated Creatinine Clearance: 38.5 mL/min (A) (by C-G formula based on SCr of 1.4 mg/dL (H)). Liver Function  Tests: Recent Labs  Lab 08/31/17 1530  AST 25  ALT 9*  ALKPHOS 75  BILITOT 1.1  PROT 5.7*  ALBUMIN 2.4*   No results for input(s): LIPASE, AMYLASE in the last 168 hours. No results for input(s): AMMONIA in the last 168 hours. Coagulation Profile: No results for input(s): INR, PROTIME in the last 168 hours. Cardiac Enzymes: Recent Labs  Lab 08/31/17 1537  TROPONINI <0.03   BNP (last 3 results) No results for input(s): PROBNP in the last 8760 hours. HbA1C: No results for input(s): HGBA1C in the last 72 hours. CBG: No results for input(s): GLUCAP in the last 168 hours. Lipid Profile: No results for input(s): CHOL, HDL, LDLCALC, TRIG, CHOLHDL, LDLDIRECT in the last 72 hours. Thyroid Function Tests: No results for input(s): TSH, T4TOTAL, FREET4, T3FREE, THYROIDAB in the last 72 hours. Anemia Panel: No results for input(s): VITAMINB12, FOLATE, FERRITIN, TIBC, IRON, RETICCTPCT in the last 72 hours. Sepsis Labs: No results for input(s): PROCALCITON, LATICACIDVEN in the last 168 hours.  Recent Results (from the past 240 hour(s))  MRSA PCR Screening     Status: None   Collection Time: 08/31/17  9:24 PM  Result Value Ref Range Status   MRSA by PCR NEGATIVE NEGATIVE Final    Comment:        The GeneXpert MRSA Assay (FDA approved for NASAL specimens only), is one component of a comprehensive MRSA colonization surveillance program. It is not intended to diagnose MRSA infection nor to guide or monitor treatment for MRSA infections. Performed at [facility], [address], [place], [place] [address]          Radiology Studies: Dg Chest Portable 1 View  Result Date: 08/31/2017 CLINICAL DATA:  84 year old female with history of swelling in the legs. Weight gain over the past 2 weeks. EXAM: PORTABLE CHEST 1 VIEW COMPARISON:  Chest x-ray 07/25/2017. FINDINGS: There is cephalization of the pulmonary vasculature and slight indistinctness of the interstitial markings  suggestive of mild pulmonary edema. Small bilateral pleural effusions. Mild cardiomegaly. Aortic atherosclerosis. IMPRESSION: 1. The appearance the chest suggests mild congestive heart failure. 2. Aortic atherosclerosis. Electronically Signed   By: Trudie Reed M.D.   On: 08/31/2017 16:20        Scheduled Meds: . aspirin EC  81 mg Oral Daily  . diltiazem  120 mg Oral Daily  . donepezil  5 mg Oral q morning - 10a  . enoxaparin (LOVENOX) injection  60 mg Subcutaneous Q24H  . famotidine  20 mg Oral QHS  . feeding supplement (ENSURE ENLIVE)  237 mL Oral BID BM  . Ferrous Fumarate  1 tablet Oral QPM  . furosemide  60 mg Intravenous BID  . ipratropium-albuterol  3 mL Nebulization Q6H  . loratadine  10 mg Oral Daily  . lubiprostone  8 mcg Oral BID WC  . montelukast  10 mg Oral QPM  . oxybutynin  5 mg Oral  Daily  . potassium chloride  40 mEq Oral TID   Continuous Infusions: . albumin human    . potassium chloride       LOS: 1 day    Time spent: 35mins Greater than 50% of this time spent in direct contact with patient discussing management of volume overload, plans for further diuresis, cardiology consult in AM and management of wheezing    Erick BlinksJehanzeb Uzziah Rigg, MD [facility] Pager [phone]  If 7PM-7AM, please contact night-coverage [email or web address] Password [email or web address] 09/01/2017, 8:59 AM

## 2017-09-02 ENCOUNTER — Inpatient Hospital Stay (HOSPITAL_COMMUNITY): Payer: Medicare Other

## 2017-09-02 ENCOUNTER — Inpatient Hospital Stay (HOSPITAL_COMMUNITY): Admission: RE | Admit: 2017-09-02 | Payer: Medicare Other | Source: Ambulatory Visit

## 2017-09-02 DIAGNOSIS — I9589 Other hypotension: Secondary | ICD-10-CM

## 2017-09-02 DIAGNOSIS — I50813 Acute on chronic right heart failure: Secondary | ICD-10-CM

## 2017-09-02 LAB — CBC
HCT: 27.9 % — ABNORMAL LOW (ref 36.0–46.0)
Hemoglobin: 8.4 g/dL — ABNORMAL LOW (ref 12.0–15.0)
MCH: 27.7 pg (ref 26.0–34.0)
MCHC: 30.1 g/dL (ref 30.0–36.0)
MCV: 92.1 fL (ref 78.0–100.0)
PLATELETS: 189 10*3/uL (ref 150–400)
RBC: 3.03 MIL/uL — ABNORMAL LOW (ref 3.87–5.11)
RDW: 14.9 % (ref 11.5–15.5)
WBC: 6.8 10*3/uL (ref 4.0–10.5)

## 2017-09-02 LAB — COMPREHENSIVE METABOLIC PANEL
ALT: 9 U/L — AB (ref 14–54)
AST: 20 U/L (ref 15–41)
Albumin: 2.8 g/dL — ABNORMAL LOW (ref 3.5–5.0)
Alkaline Phosphatase: 64 U/L (ref 38–126)
Anion gap: 8 (ref 5–15)
BUN: 60 mg/dL — AB (ref 6–20)
CHLORIDE: 100 mmol/L — AB (ref 101–111)
CO2: 34 mmol/L — AB (ref 22–32)
CREATININE: 1.32 mg/dL — AB (ref 0.44–1.00)
Calcium: 8.1 mg/dL — ABNORMAL LOW (ref 8.9–10.3)
GFR calc Af Amer: 42 mL/min — ABNORMAL LOW (ref >60)
GFR calc non Af Amer: 36 mL/min — ABNORMAL LOW (ref >60)
Glucose, Bld: 116 mg/dL — ABNORMAL HIGH (ref 65–99)
Potassium: 3.4 mmol/L — ABNORMAL LOW (ref 3.5–5.1)
SODIUM: 142 mmol/L (ref 135–145)
Total Bilirubin: 1.3 mg/dL — ABNORMAL HIGH (ref 0.3–1.2)
Total Protein: 5.8 g/dL — ABNORMAL LOW (ref 6.5–8.1)

## 2017-09-02 LAB — BLOOD GAS, ARTERIAL
ACID-BASE EXCESS: 7.8 mmol/L — AB (ref 0.0–2.0)
BICARBONATE: 31.1 mmol/L — AB (ref 20.0–28.0)
Drawn by: 277331
O2 Content: 2 L/min
O2 Saturation: 91.9 %
PCO2 ART: 52.9 mmHg — AB (ref 32.0–48.0)
PH ART: 7.407 (ref 7.350–7.450)
Patient temperature: 37
pO2, Arterial: 63.7 mmHg — ABNORMAL LOW (ref 83.0–108.0)

## 2017-09-02 LAB — GLUCOSE, CAPILLARY: Glucose-Capillary: 111 mg/dL — ABNORMAL HIGH (ref 65–99)

## 2017-09-02 LAB — HEPARIN LEVEL (UNFRACTIONATED): Heparin Unfractionated: 0.65 IU/mL (ref 0.30–0.70)

## 2017-09-02 MED ORDER — POTASSIUM CHLORIDE CRYS ER 20 MEQ PO TBCR: 40.0000 meq | EXTENDED_RELEASE_TABLET | Freq: Two times a day (BID) | ORAL | Status: DC

## 2017-09-02 MED ORDER — IPRATROPIUM BROMIDE 0.02 % IN SOLN
0.5000 mg | Freq: Four times a day (QID) | RESPIRATORY_TRACT | Status: DC
  Administered 2017-09-02 – 2017-09-04 (×7): 0.5 mg via RESPIRATORY_TRACT
  Filled 2017-09-02 (×6): qty 2.5

## 2017-09-02 MED ORDER — LEVALBUTEROL HCL 0.63 MG/3ML IN NEBU
0.6300 mg | INHALATION_SOLUTION | Freq: Four times a day (QID) | RESPIRATORY_TRACT | Status: DC
  Administered 2017-09-02 – 2017-09-04 (×7): 0.63 mg via RESPIRATORY_TRACT
  Filled 2017-09-02 (×6): qty 3

## 2017-09-02 MED ORDER — AMIODARONE HCL IN DEXTROSE 360-4.14 MG/200ML-% IV SOLN
30.0000 mg/h | INTRAVENOUS | Status: DC
  Administered 2017-09-03 – 2017-09-06 (×7): 30 mg/h via INTRAVENOUS
  Filled 2017-09-02 (×9): qty 200

## 2017-09-02 MED ORDER — LORAZEPAM 2 MG/ML IJ SOLN
1.0000 mg | Freq: Once | INTRAMUSCULAR | Status: AC
  Administered 2017-09-02: 1 mg via INTRAVENOUS
  Filled 2017-09-02: qty 1

## 2017-09-02 MED ORDER — AMIODARONE HCL IN DEXTROSE 360-4.14 MG/200ML-% IV SOLN
60.0000 mg/h | INTRAVENOUS | Status: AC
  Administered 2017-09-02: 30 mg/h via INTRAVENOUS
  Administered 2017-09-02: 60 mg/h via INTRAVENOUS
  Filled 2017-09-02: qty 200

## 2017-09-02 MED ORDER — LEVALBUTEROL HCL 0.63 MG/3ML IN NEBU: 0.6300 mg | INHALATION_SOLUTION | Freq: Four times a day (QID) | RESPIRATORY_TRACT | Status: DC | PRN

## 2017-09-02 MED ORDER — HEPARIN (PORCINE) IN NACL 100-0.45 UNIT/ML-% IJ SOLN
1250.0000 [IU]/h | INTRAMUSCULAR | Status: DC
  Administered 2017-09-02 – 2017-09-06 (×6): 1150 [IU]/h via INTRAVENOUS
  Filled 2017-09-02 (×6): qty 250

## 2017-09-02 MED ORDER — HEPARIN BOLUS VIA INFUSION
4000.0000 [IU] | Freq: Once | INTRAVENOUS | Status: AC
  Administered 2017-09-02: 4000 [IU] via INTRAVENOUS
  Filled 2017-09-02: qty 4000

## 2017-09-02 MED ORDER — SODIUM CHLORIDE 0.9% FLUSH
10.0000 mL | Freq: Two times a day (BID) | INTRAVENOUS | Status: DC
  Administered 2017-09-02 – 2017-09-07 (×6): 10 mL
  Administered 2017-09-08: 30 mL
  Administered 2017-09-08 – 2017-09-10 (×2): 10 mL
  Administered 2017-09-12: 30 mL

## 2017-09-02 MED ORDER — CHLORHEXIDINE GLUCONATE CLOTH 2 % EX PADS
6.0000 | MEDICATED_PAD | Freq: Every day | CUTANEOUS | Status: DC
  Administered 2017-09-02 – 2017-09-07 (×6): 6 via TOPICAL

## 2017-09-02 MED ORDER — MILRINONE LACTATE IN DEXTROSE 20-5 MG/100ML-% IV SOLN
0.1250 ug/kg/min | INTRAVENOUS | Status: DC
  Administered 2017-09-02 – 2017-09-05 (×4): 0.125 ug/kg/min via INTRAVENOUS
  Filled 2017-09-02 (×5): qty 100

## 2017-09-02 MED ORDER — AMIODARONE LOAD VIA INFUSION
150.0000 mg | Freq: Once | INTRAVENOUS | Status: AC
  Administered 2017-09-02: 150 mg via INTRAVENOUS
  Filled 2017-09-02: qty 83.34

## 2017-09-02 MED ORDER — SODIUM CHLORIDE 0.9% FLUSH: 10.0000 mL | INTRAVENOUS | Status: DC | PRN

## 2017-09-02 NOTE — Progress Notes (Signed)
Initial Nutrition Assessment  DOCUMENTATION CODES:   Morbid obesity(level of obesity skewed currently due to edema- pt is obese at baseline)  INTERVENTION:  Provide education Heart Healthy diet  Ensure Enlive po BID, each supplement provides 350 kcal and 20 grams of protein    NUTRITION DIAGNOSIS:   Inadequate oral intake related to lethargy/confusion as evidenced by per patient/family report, meal completion < 25%.    GOAL:   Provide needs based on ASPEN/SCCM guidelines   MONITOR:   PO intake, Weight trends, Labs  REASON FOR ASSESSMENT:   Consult(Low albumin)    ASSESSMENT: Patient is an 84 yo who lives with her daughter and son in law. She has a hx of CHF, atrial fibrillation and asthma. Home O2 (2 liters).   Home diet is regular according to son in law. Her appetite has been very good. Feeds herself.The daughter was here earlier but had to leave for work. He says pt has been weighing daily and daughter is providing the information to [facility].  RD has come to see pt two times today. This morning no family present and discussed case with her nurse. Breakfast was here untouched. Apparently she had taken a medication last night and was still lethargic. Returned this afternoon and son in law is here and lunch is still here and also untouched. The patient seems more awake but unable to eat. Ensure Shiela Mayernlive is ordered. RD will provide CHF education when pt is more alert and daughter is present. This was discussed with son in law.   Patient weight is up approximately 25 lb according to family report. Output noted below.   Nutrition focused exam; mild temporal and orbital muscle and fat loss but severe loss of deltoid and dorsal.   When a patient presents with low albumin, it is likely skewed due to the acute inflammatory response.  Unless it is suspected that patient had poor PO intake or malnutrition prior to admission, then RD should not be consulted solely for low  albumin. Note that low albumin is no longer used to diagnose malnutrition; [facility] uses the new malnutrition guidelines published by the American Society for Parenteral and Enteral Nutrition (A.S.P.E.N.) and the Academy of Nutrition and Dietetics (AND).   Albumin has a half-life of 21 days and is strongly affected by stress response and inflammatory process, therefore, do not expect to see an improvement in this lab value during acute hospitalization.   Intake/Output Summary (Last 24 hours) at 09/02/2017 1507 Last data filed at 09/02/2017 0400 Gross per 24 hour  Intake 713.67 ml  Output 800 ml  Net -86.33 ml     BMP Latest Ref Rng & Units 09/02/2017 09/01/2017 08/31/2017  Glucose 65 - 99 mg/dL 161(W116(H) 98 960(A115(H)  BUN 6 - 20 mg/dL 54(U60(H) 98(J60(H) 19(J62(H)  Creatinine 0.44 - 1.00 mg/dL 4.78(G1.32(H) 9.56(O1.40(H) 1.30(Q1.40(H)  Sodium 135 - 145 mmol/L 142 142 139  Potassium 3.5 - 5.1 mmol/L 3.4(L) 2.5(LL) 2.6(LL)  Chloride 101 - 111 mmol/L 100(L) 100(L) 98(L)  CO2 22 - 32 mmol/L 34(H) 33(H) 32  Calcium 8.9 - 10.3 mg/dL 8.1(L) 7.9(L) 7.9(L)    Meds reviewed: Ensure Enlive, lasix, Albumin 50 gr   NUTRITION - FOCUSED PHYSICAL EXAM:    Most Recent Value  Orbital Region  Mild depletion  Upper Arm Region  No depletion  Thoracic and Lumbar Region  No depletion  Buccal Region  Mild depletion  Temple Region  Mild depletion  Clavicle Bone Region  No depletion  Clavicle  and Acromion Bone Region  Severe depletion  Scapular Bone Region  Unable to assess  Dorsal Hand  Severe depletion  Posterior Calf Region  -- [bilateral redness, warmth and signifiant edema ]  Edema (RD Assessment)  Severe [bilateral lower extremities]  Hair  Reviewed  Eyes  Reviewed  Mouth  Unable to assess  Skin  Reviewed  Nails  Reviewed      Diet Order:   Diet Order           Diet Heart Room service appropriate? Yes; Fluid consistency: Thin  Diet effective now          EDUCATION NEEDS:   Not appropriate for education at this time(pt  not alert enough today for edu and she lives with daughter who prepares meals and shops for their food) Skin:  Skin Assessment: Reviewed RN Assessment(Redness and warm to touch, weeping bilateral lower legs)  Last BM:  6/23  type 5   Height:   Ht Readings from Last 1 Encounters:  08/31/17 5\' 4"  (1.626 m)    Weight:   Wt Readings from Last 1 Encounters:  09/02/17 270 lb 8.1 oz (122.7 kg)    Ideal Body Weight:  55 kg  BMI:  Body mass index is 46.43 kg/m.  Estimated Nutritional Needs:   Kcal:  1700-1800  Protein:  80-85 gr  Fluid:  per MD goals   Royann Shivers MS,RD,CSG,LDN Office: [phone] Pager: [phone]

## 2017-09-02 NOTE — Progress Notes (Signed)
Blood pressure noted to be in the 80's and patient is on amiodarone drip at 60mg /hr per Dr. Wyline MoodBranch. Writer notified Dr. Kerry HoughMemon and Dr. Wyline MoodBranch. Amiodarone drip decreased to 30mg /hr @1210  per Dr. Wyline MoodBranch.

## 2017-09-02 NOTE — Progress Notes (Signed)
At 4pm, Clinical research associatewriter and tech gave patient a bath and patient was alert, responding and talking. Patient drank some ginger ale and requested something for bilateral feet pain. PRN Tylenol given (crushed in applesauce), patient tolerated well. @ 1710, patient was noted to be difficult to arouse, would barely and briefly open her eyes and not responding to questions or talking. Writer notified Dr. Kerry HoughMemon who came to assess patient. Daughter in with patient, emotional support provided.  @1850 -Picc line in place, x-ray up. Awaiting results as to if ok to use PICC line. Patient a little more responsive and answers when name called before going back to sleep. Discussed with Dr. Kerry HoughMemon and Dr. Wyline MoodBranch. Daughter at bedside.

## 2017-09-02 NOTE — Progress Notes (Signed)
A-fib rate 120's noted to monitor. Patient takes metoprolol @ home, but no current orders for metoprolol. Floor coverage MD notified. One time order Metoprolol 2.5 mg IV given per order. Now showing a-fib @ 90 bpm.

## 2017-09-02 NOTE — Progress Notes (Signed)
PROGRESS NOTE    ALEXXIS MACKERT  GEX:[id number] DOB: [birth year] DOA: 08/31/2017 PCP: Kirstie Peri, MD    Brief Narrative:  84 year old female with a history of CHF, admitted to the hospital with volume overload. Found to have significant right sided heart failure with concomitant low blood pressures. Seen by cardiology and is on IV diuretics. Milrinone also added to heart failure regimen. She has a history of A fib, and developed RVR during hospital stay. Currently on amiodarone infusion and heparin infusion. She is critically ill.   Assessment & Plan:   Principal Problem:   Hypotension Active Problems:   Hypertension   CHF (congestive heart failure) (HCC)   GERD (gastroesophageal reflux disease)   A-fib (HCC)   Hypokalemia   Hypoalbuminemia   1. Acute on chronic right sided congestive heart failure. Patient was admitted with worsening edema and that was not responding to increasing doses of oral diuretics.  She was noted to be hypotensive on arrival.  Echo performed showed that RV was severely dilated with severe TR and RV dysfunction.  Cardiology following, appreciate assistance. PICC line has been placed. Will check CVP readings. Check Cooxemetry panel. Start on milrinone. If patient becomes hypotensive, can consider starting phenylephrine. Continue IV lasix for now. May need to consider lasix infusion if urine output does not improve. 2. Persistent atrial fibrillation.  Currently has rapid ventricular response. Started on amiodarone infusion due to tachycardia and soft blood pressures. Also started on heparin infusion for anticoagulation. Follow hemoglobin 3. Anemia. Unclear what baseline hemoglobin is. Continue to monitor with on going anticoagulation. No signs of bleeding at this time 4. Acute resp failure with hypoxia. Related to CHF exacerbation. Since she has had more cough, will repeat chest xray 5. Lethargy. Abg shows mildly elevated PCO2, but pH is normal indicating chronic  process. No signs of infection. CBG normal. Hemodynamic instability (low bp) may be playing a role. She did receive ativan overnight that may be contributing. Continue to follow. 6. HTN.  She is chronically on metoprolol and diltiazem, which are currently on hold due to relative low blood pressures 7. GERD.  Continue Protonix 8. Hypoalbuminemia.  Nutrition consult.   9. Hypokalemia. Improved with replacement   DVT prophylaxis: heparin infusion Code Status: DNR Family Communication: Discussed with daughter at the bedside Disposition Plan: Pending hospital course, discharge home once improved   Consultants:   cardiology  Procedures:  Echo: - Left ventricle: The cavity size was normal. Wall thickness was   normal. Systolic function was normal. The estimated ejection   fraction was in the range of 60% to 65%. Wall motion was normal;   there were no regional wall motion abnormalities. The study is   not technically sufficient to allow evaluation of LV diastolic   function. - Ventricular septum: The contour showed diastolic flattening, RV   volume overload. - Aortic valve: Valve mobility was restricted. There was trivial   regurgitation. Peak velocity (S): 224 cm/s. - Left atrium: The atrium was mildly dilated. Volume/bsa, ES   (1-plane Simpson&'s, A4C): 37.2 ml/m^2. - Right ventricle: The cavity size was mildly dilated. Wall   thickness was normal. - Right atrium: The atrium was moderately dilated. - Tricuspid valve: There was moderate-severe regurgitation. - Pulmonary arteries: Systolic pressure was mildly increased. PA   peak pressure: 34 mm Hg (S).    Antimicrobials:       Subjective: Received trazodone and ativan overnight. She is somnolent this morning  Objective: Vitals:   09/02/17 1615 09/02/17  1645 09/02/17 1715 09/02/17 1800  BP: (!) 103/53 (!) 87/47 (!) 94/49 (!) 95/56  Pulse: (!) 128 (!) 112 (!) 119 (!) 108  Resp: 18 17 18 17   Temp:      TempSrc:        SpO2: 92% 94% 93% 96%  Weight:      Height:        Intake/Output Summary (Last 24 hours) at 09/02/2017 1822 Last data filed at 09/02/2017 1500 Gross per 24 hour  Intake 1502.61 ml  Output 800 ml  Net 702.61 ml   Filed Weights   08/31/17 2125 09/01/17 0500 09/02/17 0500  Weight: 120.2 kg (264 lb 15.9 oz) 121.6 kg (268 lb 1.3 oz) 122.7 kg (270 lb 8.1 oz)    Examination:  General exam: somnolent, wakes up to voice Respiratory system: mostly clear, some crackles at bases. Mildly increased resp effort Cardiovascular system: irregular. No murmurs, rubs, gallops. Gastrointestinal system: Abdomen is nondistended, soft and nontender. No organomegaly or masses felt. Normal bowel sounds heard. Central nervous system:  No focal neurological deficits. Extremities: 2-3+ bilateral pitting edema Skin: venous stasis changes in lower extremities bilaterally Psychiatry: somnolent      Data Reviewed: I have personally reviewed following labs and imaging studies  CBC: Recent Labs  Lab 08/31/17 1530 09/02/17 0506  WBC 5.1 6.8  NEUTROABS 3.3  --   HGB 8.7* 8.4*  HCT 28.1* 27.9*  MCV 89.2 92.1  PLT 206 189   Basic Metabolic Panel: Recent Labs  Lab 08/31/17 1530 09/01/17 0456 09/02/17 0506  NA 139 142 142  K 2.6* 2.5* 3.4*  CL 98* 100* 100*  CO2 32 33* 34*  GLUCOSE 115* 98 116*  BUN 62* 60* 60*  CREATININE 1.40* 1.40* 1.32*  CALCIUM 7.9* 7.9* 8.1*  MG  --  2.3  --    GFR: Estimated Creatinine Clearance: 41 mL/min (A) (by C-G formula based on SCr of 1.32 mg/dL (H)). Liver Function Tests: Recent Labs  Lab 08/31/17 1530 09/02/17 0506  AST 25 20  ALT 9* 9*  ALKPHOS 75 64  BILITOT 1.1 1.3*  PROT 5.7* 5.8*  ALBUMIN 2.4* 2.8*   No results for input(s): LIPASE, AMYLASE in the last 168 hours. No results for input(s): AMMONIA in the last 168 hours. Coagulation Profile: No results for input(s): INR, PROTIME in the last 168 hours. Cardiac Enzymes: Recent Labs  Lab  08/31/17 1537  TROPONINI <0.03   BNP (last 3 results) No results for input(s): PROBNP in the last 8760 hours. HbA1C: No results for input(s): HGBA1C in the last 72 hours. CBG: Recent Labs  Lab 09/02/17 1728  GLUCAP 111*   Lipid Profile: No results for input(s): CHOL, HDL, LDLCALC, TRIG, CHOLHDL, LDLDIRECT in the last 72 hours. Thyroid Function Tests: No results for input(s): TSH, T4TOTAL, FREET4, T3FREE, THYROIDAB in the last 72 hours. Anemia Panel: No results for input(s): VITAMINB12, FOLATE, FERRITIN, TIBC, IRON, RETICCTPCT in the last 72 hours. Sepsis Labs: No results for input(s): PROCALCITON, LATICACIDVEN in the last 168 hours.  Recent Results (from the past 240 hour(s))  MRSA PCR Screening     Status: None   Collection Time: 08/31/17  9:24 PM  Result Value Ref Range Status   MRSA by PCR NEGATIVE NEGATIVE Final    Comment:        The GeneXpert MRSA Assay (FDA approved for NASAL specimens only), is one component of a comprehensive MRSA colonization surveillance program. It is not intended to diagnose MRSA infection nor  to guide or monitor treatment for MRSA infections. Performed at [facility], [address], [place], [place] [address]          Radiology Studies: No results found.      Scheduled Meds: . aspirin EC  81 mg Oral Daily  . Chlorhexidine Gluconate Cloth  6 each Topical Daily  . feeding supplement (ENSURE ENLIVE)  237 mL Oral BID BM  . furosemide  60 mg Intravenous BID  . ipratropium  0.5 mg Nebulization Q6H  . levalbuterol  0.63 mg Nebulization Q6H  . sodium chloride flush  10-40 mL Intracatheter Q12H   Continuous Infusions: . albumin human 25 g (09/02/17 1022)  . amiodarone 30 mg/hr (09/02/17 1617)  . heparin 1,150 Units/hr (09/02/17 1111)     LOS: 2 days    Critical care time: , discussing management with patient, daughter, reviewing chest xrays and ekgs, as well as coordinating care with cardiology    Erick Blinks,  MD [facility] Pager [phone]  If 7PM-7AM, please contact night-coverage [email or web address] Password [facility] 09/02/2017, 6:22 PM

## 2017-09-02 NOTE — Consult Note (Addendum)
Cardiology Office Note    Date:  09/02/2017   ID:  Maudell, Stanbrough 1932/01/13, MRN [id number]  PCP:  Kirstie Peri, MD  Cardiologist: Prentice Docker, MD-previously followed by cardiologist in [place]. [place]  Chief Complaint  Patient presents with  . Leg Swelling    History of Present Illness:  MELANIA KIRKS is a 84 y.o. female who saw Dr. Purvis Sheffield 08/15/17 in the office for atrial fibrillation and CHF difficult control on torsemide and metolazone as well as hypotension. Also has asthma on home O2 at 2L. 2Decho 05/2015 LVEF 45-50%. Repeat yesterday showed LVEF 60-65%, moderately dilated RA Patient had side effects on Xarelto and hasn't been anticoagulated. HR was controlled with diltiazem and toprol.  Patient was admitted Friday with CHF, 30 lb weight gain over several months and Afib with RVR. K was 2.6 now 3.4. Over the weekend she became hypotensive and received IV fluids which were stopped yesterday and then given IV Lasix. Hgb 8.7 now 8.4, given albumin yest All history given by daughter who she lives with as patient received trazadone last night and is difficult awakening.   Past Medical History:  Diagnosis Date  . A-fib (HCC)   . CHF (congestive heart failure) (HCC)   . GERD (gastroesophageal reflux disease)   . HTN (hypertension)   . Hypertension     Past Surgical History:  Procedure Laterality Date  . ABDOMINAL HYSTERECTOMY    . BRONCHOSCOPY  2017   aspirated what was felt to be iron tablet, evaluated at [facility] with bronchs  . CESAREAN SECTION    . CHOLECYSTECTOMY    . HERNIA REPAIR      Current Medications: Current Meds  Medication Sig  . albuterol (PROVENTIL) (2.5 MG/3ML) 0.083% nebulizer solution USE ONE VIAL IN NEBULIZER FOUR TIMES DAILY AS NEEDED FOR SHORTNESS OF BREATH/WHEEZING  . aspirin EC 81 MG tablet Take 81 mg by mouth daily.  . cetirizine (ZYRTEC) 10 MG tablet Take 10 mg by mouth daily.  Marland Kitchen diltiazem (CARDIZEM CD) 120 MG 24 hr capsule Take 120 mg by  mouth 2 (two) times daily.  Marland Kitchen donepezil (ARICEPT) 5 MG tablet Take 5 mg by mouth every morning.   . famotidine (PEPCID) 20 MG tablet Take 20 mg by mouth at bedtime.  . Ferrous Fumarate (HEMOCYTE - 106 MG FE) 324 (106 Fe) MG TABS tablet Take 1 tablet by mouth every evening.   . hydrocortisone (ANUSOL-HC) 2.5 % rectal cream Place 1 application rectally 2 (two) times daily. (Patient taking differently: Place 1 application rectally 2 (two) times daily as needed for hemorrhoids or anal itching. )  . hydrocortisone (ANUSOL-HC) 25 MG suppository Place 1 suppository (25 mg total) rectally every 12 (twelve) hours. (Patient taking differently: Place 25 mg rectally 2 (two) times daily as needed for hemorrhoids or anal itching. )  . lubiprostone (AMITIZA) 8 MCG capsule Take 8 mcg by mouth 2 (two) times daily with a meal.   . metolazone (ZAROXOLYN) 2.5 MG tablet Take 2.5 mg by mouth every morning.  . metoprolol (LOPRESSOR) 50 MG tablet Take 50 mg by mouth 2 (two) times daily.  . montelukast (SINGULAIR) 10 MG tablet Take 10 mg by mouth every evening.  . Multiple Vitamin (MULTIVITAMIN) tablet Take 1 tablet by mouth daily.  Marland Kitchen oxybutynin (DITROPAN) 5 MG tablet Take 5 mg by mouth daily.  . potassium chloride SA (K-DUR,KLOR-CON) 20 MEQ tablet Take 10 mEq by mouth 2 (two) times daily.  Marland Kitchen torsemide (DEMADEX) 100 MG tablet  Take 100 mg by mouth every morning.      Allergies:   Sulfa antibiotics and Quinine derivatives   Social History   Socioeconomic History  . Marital status: Married    Spouse name: Not on file  . Number of children: Not on file  . Years of education: Not on file  . Highest education level: Not on file  Occupational History  . Not on file  Social Needs  . Financial resource strain: Not on file  . Food insecurity:    Worry: Not on file    Inability: Not on file  . Transportation needs:    Medical: Not on file    Non-medical: Not on file  Tobacco Use  . Smoking status: Never Smoker  .  Smokeless tobacco: Never Used  Substance and Sexual Activity  . Alcohol use: No  . Drug use: No  . Sexual activity: Not on file  Lifestyle  . Physical activity:    Days per week: Not on file    Minutes per session: Not on file  . Stress: Not on file  Relationships  . Social connections:    Talks on phone: Not on file    Gets together: Not on file    Attends religious service: Not on file    Active member of club or organization: Not on file    Attends meetings of clubs or organizations: Not on file    Relationship status: Not on file  Other Topics Concern  . Not on file  Social History Narrative  . Not on file     Family History:  The patient has no family history of CAD  ROS:   Please see the history of present illness.    Review of Systems  Reason unable to perform ROS: patient difficult to awaken after sleep meds given.   All other systems reviewed and are negative.   PHYSICAL EXAM:   VS:  BP (!) 106/50   Pulse (!) 121   Temp 97.9 F (36.6 C)   Resp (!) 22   Ht 5\' 4"  (1.626 m)   Wt 270 lb 8.1 oz (122.7 kg)   SpO2 91%   BMI 46.43 kg/m   Physical Exam  GEN: obese, coughing, in no acute distress  HEENT: normal  Neck: increased JVD, no carotid bruits, or masses Cardiac:irreg irreg 2/6 sys murmur LSB Respiratory:  Decreased breath sounds with diffuse wheezing & rhonchi GI: anasarcasoft, nontender, nondistended, + BS Ext: lymphedema with anasarca MS: no deformity or atrophy  Skin: warm and dry, no rash Neuro: lethargic, difficult awakening. Psych: sleeping  Wt Readings from Last 3 Encounters:  09/02/17 270 lb 8.1 oz (122.7 kg)  08/15/17 267 lb (121.1 kg)  07/30/17 249 lb 9.6 oz (113.2 kg)      Studies/Labs Reviewed:   EKG:  EKG is  ordered today.  The ekg from 08/31/17 Afib with controlled rate Recent Labs: 08/31/2017: B Natriuretic Peptide 492.0 09/01/2017: Magnesium 2.3 09/02/2017: ALT 9; BUN 60; Creatinine, Ser 1.32; Hemoglobin 8.4; Platelets 189;  Potassium 3.4; Sodium 142   Lipid Panel No results found for: CHOL, TRIG, HDL, CHOLHDL, VLDL, LDLCALC, LDLDIRECT  Additional studies/ records that were reviewed today include:   Study Conclusions   - Left ventricle: The cavity size was normal. Wall thickness was   normal. Systolic function was normal. The estimated ejection   fraction was in the range of 60% to 65%. Wall motion was normal;   there were no regional wall  motion abnormalities. The study is   not technically sufficient to allow evaluation of LV diastolic   function. - Ventricular septum: The contour showed diastolic flattening, RV   volume overload. - Aortic valve: Valve mobility was restricted. There was trivial   regurgitation. Peak velocity (S): 224 cm/s. - Left atrium: The atrium was mildly dilated. Volume/bsa, ES   (1-plane Simpson&'s, A4C): 37.2 ml/m^2. - Right ventricle: The cavity size was mildly dilated. Wall   thickness was normal. - Right atrium: The atrium was moderately dilated. - Tricuspid valve: There was moderate-severe regurgitation. - Pulmonary arteries: Systolic pressure was mildly increased. PA   peak pressure: 34 mm Hg (S).    ASSESSMENT:    Acute on chronic diastolic/RV failure CHF with 40 lb weight gain, anasarca, normal LV systolic function on echo yest. Mod-severe TR-I/O's positive 860 cc since admission. On Lasix 60 mg IV BID(was on torsemide 100 mg daily and Z 2.5 mg daily PTA). Consider right heart cath  CAF with RVR on diltiazem 120 mg daily CHADSVASC=5. ? If she would do better in NSR-LA only mildly dilated, but not anticoagulated so don't want to convert now. Consider Amiodarone.  Hypotension limiting treatment  Asthma-receiving breathing treatments.  Anemia-Hgb 8.4 today could partially be dilutional  CKD stage III Crt 1.32  Hypoalbuminemia could be contributing to anasarca.    PLAN:  In order of problems listed above:      Medication Adjustments/Labs and Tests  Ordered: Current medicines are reviewed at length with the patient today.  Concerns regarding medicines are outlined above.  Medication changes, Labs and Tests ordered today are listed in the Patient Instructions below. There are no outpatient Patient Instructions on file for this admission.   Signed, Jacolyn ReedyMichele Lenze, PA-C  09/02/2017 9:22 AM    [facility] [address], [place], [place]  [address] Phone: [phone]) [phone]; Fax: [phone](336) [phone]    Patient seen and disucssed with PA Geni BersLenze, I agree with her documentation above. 84 yo female history of PAF, LE edema, mild to low normal LV systolic function, chronic diastolic HF. She reports side effects on anticoag in the past but unsure of details, discussing further with primary cardiologist whether to start at next follow up. . Admitted with increased LE dema, weight gain (30 lbs over 3-4 months per family). Found to be hypotensive in ER with AKI. Historically has been on high dose diuretics at home, despite aggressive regimen worsening LE edema and weight gain.     Weights 6/23 270   6/6 270  5/21 249  05/2017 wt 245 lbs Hgb 8.7 Plt 206 K 2.6 Cr 1.4 (up from 0.76) BUN 62 BNP 492  Trop neg  Echo LVEF 60-65%, no WMAs, cannot eval diastolic dysfunction, RV volume overload, mild LAE, PASP 34, reported normal RV function. Dialted IVC   Clinic weights indicate she is up 25 lbs since 05/2017. Complicated clinical picture with her AKI and hypotension which improved with IVFs would suggest she was  intrasvascularly depleted, probable low CO due to RV dysfunction. . Echo with normal LVEF, cannot eval diastolic function though probably some. RV function reported as normal, mod to severe TR, PASP 34, with flattened septum suggesting RV volume overload. Albumin 2.8. I reviewed echo, RV somewhat poorly visualized. Looks to be severely dilated. At lest moderate dysfunction despite tissue anular velocity of 10(no TAPSE done). Systolic hepatic  flow reversal would suggest severe TR. PASP misleading with severe TR and RV dysfunction. Her clinical course  and echo would support severe right sided heart failure not responding to diuresis.   We will work to improve her rate/rhythm control in setting of RV heart failure. Options limited due to low bp's. Start amiodarone drip, since change of chemical conversion start heparin gtt. Discussed possibility of RHC with family and possible considerations for inotropic support for diuresis, Family open to options but currently undecided, will come back later today to readdress. Continue IV lasix at 60mg  IV bid for now. If family agrees would consider transfer to [facility] to medicine service with CHF team consultation to consider RHC and possible inotropic assisted diuresis.   Anemia per primary team, follow on hep gtt.  Family reports 3 months ago patient able to walk with assistance of cane. Could feed herself, dress with assistance. Has dementia but able to have conversations, aware of day time and year. Currently more altered than prior baseline.    Dina Rich MD

## 2017-09-02 NOTE — Progress Notes (Signed)
  ANTICOAGULATION CONSULT NOTE  Pharmacy Consult for  heparin dosing Indication: atrial fibrillation  Allergies  Allergen Reactions  . Sulfa Antibiotics Rash  . Quinine Derivatives Other (See Comments)    Bleeding from mouth. Affteced bone marrow. Decreased WBC.   Patient Measurements: Height: 5\' 4"  (162.6 cm) Weight: 270 lb 8.1 oz (122.7 kg) IBW/kg (Calculated) : 54.7 Heparin Dosing Weight: HEPARIN DW (KG): 83.9  Vital Signs: Temp: 98.8 F (37.1 C) (06/24 1215) Temp Source: Oral (06/24 1215) BP: 82/49 (06/24 2015) Pulse Rate: 105 (06/24 2023)  Labs: Recent Labs    08/31/17 1530 08/31/17 1537 09/01/17 0456 09/02/17 0506 09/02/17 1950  HGB 8.7*  --   --  8.4*  --   HCT 28.1*  --   --  27.9*  --   PLT 206  --   --  189  --   HEPARINUNFRC  --   --   --   --  0.65  CREATININE 1.40*  --  1.40* 1.32*  --   TROPONINI  --  <0.03  --   --   --    Estimated Creatinine Clearance: 41 mL/min (A) (by C-G formula based on SCr of 1.32 mg/dL (H)).  Assessment: [age] yof with atrial fibrillation admitted with leg swelling and chronic diastolic/RV failure CHF.    Heparin level @ goal.  Goal of Therapy:   Heparin level 0.3-0.7 IU/mL   Plan:  Continue heparin infusion at 1150 units/hr Check anti-Xa level daily while on heparin Continue to monitor H&H and platelets   Mady GemmaHayes, Tammera Engert R 09/02/2017,8:28 PM

## 2017-09-02 NOTE — Progress Notes (Signed)
[facility]  Patient Status: Active (receiving services up to time of hospitalization)  [facility] is providing the following services: RN, PT and OT  If patient discharges after hours, please call [phone].   Loleta BooksKathleen J Cheek 09/02/2017, 2:23 PM

## 2017-09-02 NOTE — Progress Notes (Signed)
Amio lowered to 30mg /hr, bp's trending up from 80s to low 100s. Awaiting PICC line. Once placed would transduce CVP and also obtain cooximetry panel to measure central line venous O2 saturation. With central access would be more aggressive with amio dosing even if requires some vasopressor support, obtaining SR is important in setting of her RV failure. Pending heart rates would also consider starting low dose milrinone overnight for RV inotropic support and pulmonary vasodilation, once again if bp's needed support would use vasopressor, likely neosynephrine to avoid further tachycardia.    Dina RichJonathan Branch MD

## 2017-09-02 NOTE — Progress Notes (Signed)
MEDICATION RELATED CONSULT NOTE - INITIAL   Pharmacy Consult for Milrinone Indication: Acute Inotropic Support  Allergies  Allergen Reactions  . Sulfa Antibiotics Rash  . Quinine Derivatives Other (See Comments)    Bleeding from mouth. Affteced bone marrow. Decreased WBC.   Patient Measurements: Height: 5\' 4"  (162.6 cm) Weight: 270 lb 8.1 oz (122.7 kg) IBW/kg (Calculated) : 54.7  Vital Signs: Temp: 98.8 F (37.1 C) (06/24 1215) Temp Source: Oral (06/24 1215) BP: 95/56 (06/24 1800) Pulse Rate: 108 (06/24 1800) Intake/Output from previous day: 06/23 0701 - 06/24 0700 In: 1290.3 [IV Piggyback:1290.3] Out: 1150 [Urine:1150] Intake/Output from this shift: Total I/O In: 788.9 [I.V.:238.9; IV Piggyback:550] Out: -   Labs: Recent Labs    08/31/17 1530 09/01/17 0456 09/02/17 0506  WBC 5.1  --  6.8  HGB 8.7*  --  8.4*  HCT 28.1*  --  27.9*  PLT 206  --  189  CREATININE 1.40* 1.40* 1.32*  MG  --  2.3  --   ALBUMIN 2.4*  --  2.8*  PROT 5.7*  --  5.8*  AST 25  --  20  ALT 9*  --  9*  ALKPHOS 75  --  64  BILITOT 1.1  --  1.3*  BILIDIR 0.4  --   --   IBILI 0.7  --   --    Estimated Creatinine Clearance: 41 mL/min (A) (by C-G formula based on SCr of 1.32 mg/dL (H)).  Medical History: Past Medical History:  Diagnosis Date  . A-fib (HCC)   . CHF (congestive heart failure) (HCC)   . GERD (gastroesophageal reflux disease)   . HTN (hypertension)   . Hypertension    Medications:  Scheduled:  . aspirin EC  81 mg Oral Daily  . feeding supplement (ENSURE ENLIVE)  237 mL Oral BID BM  . furosemide  60 mg Intravenous BID  . ipratropium  0.5 mg Nebulization Q6H  . levalbuterol  0.63 mg Nebulization Q6H   Infusions:  . albumin human 25 g (09/02/17 1022)  . amiodarone 30 mg/hr (09/02/17 1617)  . heparin 1,150 Units/hr (09/02/17 1111)   Assessment: Okay for Protocol, recent low BP noted. CrCL < 50 ml/min  Goal of Therapy:  Acute Inotropic Support  Plan:  Start  Milrinone at low dose due to recent low BP and CrCl Follow patients on milrinone daily Pay specific attention to SBP, if low consider contacting doctor to adjust dose Note extent of diuresis  Mady GemmaHayes, Sheana Bir R 09/02/2017,6:13 PM

## 2017-09-02 NOTE — Progress Notes (Signed)
  ANTICOAGULATION CONSULT NOTE - Initial Consult  Pharmacy Consult for  heparin dosing Indication: atrial fibrillation  Allergies  Allergen Reactions  . Sulfa Antibiotics Rash  . Quinine Derivatives Other (See Comments)    Bleeding from mouth. Affteced bone marrow. Decreased WBC.    Patient Measurements: Height: 5\' 4"  (162.6 cm) Weight: 270 lb 8.1 oz (122.7 kg) IBW/kg (Calculated) : 54.7 Heparin Dosing Weight: HEPARIN DW (KG): 83.9  Vital Signs: Temp: 97.9 F (36.6 C) (06/24 0830) Temp Source: Oral (06/24 0500) BP: 107/63 (06/24 1030) Pulse Rate: 127 (06/24 1030)  Labs: Recent Labs    08/31/17 1530 08/31/17 1537 09/01/17 0456 09/02/17 0506  HGB 8.7*  --   --  8.4*  HCT 28.1*  --   --  27.9*  PLT 206  --   --  189  CREATININE 1.40*  --  1.40* 1.32*  TROPONINI  --  <0.03  --   --     Estimated Creatinine Clearance: 41 mL/min (A) (by C-G formula based on SCr of 1.32 mg/dL (H)).   Medical History: Past Medical History:  Diagnosis Date  . A-fib (HCC)   . CHF (congestive heart failure) (HCC)   . GERD (gastroesophageal reflux disease)   . HTN (hypertension)   . Hypertension     Medications:  Scheduled:  . amiodarone  150 mg Intravenous Once  . aspirin EC  81 mg Oral Daily  . enoxaparin (LOVENOX) injection  60 mg Subcutaneous Q24H  . feeding supplement (ENSURE ENLIVE)  237 mL Oral BID BM  . furosemide  60 mg Intravenous BID  . ipratropium  0.5 mg Nebulization Q6H  . levalbuterol  0.63 mg Nebulization Q6H    Assessment:  86 yof with atrial fibrillation admitted with leg swelling and chronic diastolic/RV failure CHF.     Goal of Therapy:   Heparin level 0.3-0.7 IU/mL    Plan:  Give 4000 units bolus x 1 Start heparin infusion at 1150 units/hr Check anti-Xa level in 8 hours and daily while on heparin Continue to monitor H&H and platelets    Tama Highamara Wolfgang Finigan 09/02/2017,10:39 AM

## 2017-09-02 NOTE — Progress Notes (Signed)
Peripherally Inserted Central Catheter/Midline Placement  The IV Nurse has discussed with the patient and/or persons authorized to consent for the patient, the purpose of this procedure and the potential benefits and risks involved with this procedure.  The benefits include less needle sticks, lab draws from the catheter, and the patient may be discharged home with the catheter. Risks include, but not limited to, infection, bleeding, blood clot (thrombus formation), and puncture of an artery; nerve damage and irregular heartbeat and possibility to perform a PICC exchange if needed/ordered by physician.  Alternatives to this procedure were also discussed.  Bard Power PICC patient education guide, fact sheet on infection prevention and patient information card has been provided to patient /or left at bedside.    PICC/Midline Placement Documentation     Consent obtained with daughter   Timmothy Soursewman, Terrisa Curfman Renee 09/02/2017, 6:17 PM

## 2017-09-03 ENCOUNTER — Encounter (HOSPITAL_COMMUNITY): Payer: Self-pay | Admitting: Family Medicine

## 2017-09-03 ENCOUNTER — Other Ambulatory Visit: Payer: Self-pay | Admitting: Gastroenterology

## 2017-09-03 DIAGNOSIS — I482 Chronic atrial fibrillation: Secondary | ICD-10-CM

## 2017-09-03 DIAGNOSIS — J9600 Acute respiratory failure, unspecified whether with hypoxia or hypercapnia: Secondary | ICD-10-CM

## 2017-09-03 DIAGNOSIS — I481 Persistent atrial fibrillation: Secondary | ICD-10-CM

## 2017-09-03 DIAGNOSIS — E876 Hypokalemia: Secondary | ICD-10-CM

## 2017-09-03 DIAGNOSIS — R579 Shock, unspecified: Secondary | ICD-10-CM

## 2017-09-03 DIAGNOSIS — E8809 Other disorders of plasma-protein metabolism, not elsewhere classified: Secondary | ICD-10-CM

## 2017-09-03 DIAGNOSIS — I50810 Right heart failure, unspecified: Secondary | ICD-10-CM

## 2017-09-03 DIAGNOSIS — J9601 Acute respiratory failure with hypoxia: Secondary | ICD-10-CM

## 2017-09-03 DIAGNOSIS — K219 Gastro-esophageal reflux disease without esophagitis: Secondary | ICD-10-CM

## 2017-09-03 DIAGNOSIS — I10 Essential (primary) hypertension: Secondary | ICD-10-CM

## 2017-09-03 DIAGNOSIS — I5023 Acute on chronic systolic (congestive) heart failure: Secondary | ICD-10-CM

## 2017-09-03 DIAGNOSIS — N179 Acute kidney failure, unspecified: Secondary | ICD-10-CM

## 2017-09-03 LAB — GLUCOSE, CAPILLARY: Glucose-Capillary: 102 mg/dL — ABNORMAL HIGH (ref 70–99)

## 2017-09-03 LAB — BASIC METABOLIC PANEL
ANION GAP: 9 (ref 5–15)
Anion gap: 7 (ref 5–15)
BUN: 61 mg/dL — ABNORMAL HIGH (ref 8–23)
BUN: 57 mg/dL — ABNORMAL HIGH (ref 8–23)
CO2: 35 mmol/L — ABNORMAL HIGH (ref 22–32)
CO2: 34 mmol/L — ABNORMAL HIGH (ref 22–32)
Calcium: 8.1 mg/dL — ABNORMAL LOW (ref 8.9–10.3)
Calcium: 8.2 mg/dL — ABNORMAL LOW (ref 8.9–10.3)
Chloride: 100 mmol/L (ref 98–111)
Chloride: 95 mmol/L — ABNORMAL LOW (ref 98–111)
Creatinine, Ser: 1.36 mg/dL — ABNORMAL HIGH (ref 0.44–1.00)
Creatinine, Ser: 1.35 mg/dL — ABNORMAL HIGH (ref 0.44–1.00)
GFR calc Af Amer: 40 mL/min — ABNORMAL LOW (ref >60)
GFR calc Af Amer: 41 mL/min — ABNORMAL LOW (ref >60)
GFR calc non Af Amer: 35 mL/min — ABNORMAL LOW (ref >60)
GFR, EST NON AFRICAN AMERICAN: 35 mL/min — AB (ref >60)
GLUCOSE: 115 mg/dL — AB (ref 70–99)
Glucose, Bld: 214 mg/dL — ABNORMAL HIGH (ref 70–99)
POTASSIUM: 2.9 mmol/L — AB (ref 3.5–5.1)
Potassium: 3.1 mmol/L — ABNORMAL LOW (ref 3.5–5.1)
SODIUM: 138 mmol/L (ref 135–145)
Sodium: 142 mmol/L (ref 135–145)

## 2017-09-03 LAB — COOXEMETRY PANEL
CARBOXYHEMOGLOBIN: 1.9 % — AB (ref 0.5–1.5)
Carboxyhemoglobin: 3.1 % — ABNORMAL HIGH (ref 0.5–1.5)
METHEMOGLOBIN: 1.8 % — AB (ref 0.0–1.5)
Methemoglobin: 0.7 % (ref 0.0–1.5)
O2 SAT: 78.4 %
O2 Saturation: 98.1 %
TOTAL HEMOGLOBIN: 7.8 g/dL — AB (ref 12.0–16.0)
Total hemoglobin: 7.8 g/dL — ABNORMAL LOW (ref 12.0–16.0)

## 2017-09-03 LAB — HEPARIN LEVEL (UNFRACTIONATED): HEPARIN UNFRACTIONATED: 0.59 [IU]/mL (ref 0.30–0.70)

## 2017-09-03 LAB — MAGNESIUM: MAGNESIUM: 2.2 mg/dL (ref 1.7–2.4)

## 2017-09-03 MED ORDER — SALINE SPRAY 0.65 % NA SOLN
1.0000 | NASAL | Status: DC | PRN
Start: 2017-09-03 — End: 2017-09-12
  Administered 2017-09-03 – 2017-09-07 (×2): 1 via NASAL
  Filled 2017-09-03 (×2): qty 44

## 2017-09-03 MED ORDER — METOLAZONE 10 MG PO TABS
5.0000 mg | ORAL_TABLET | Freq: Every day | ORAL | Status: AC
  Administered 2017-09-03: 5 mg via ORAL
  Filled 2017-09-03: qty 1

## 2017-09-03 MED ORDER — FUROSEMIDE 10 MG/ML IJ SOLN
80.0000 mg | Freq: Once | INTRAMUSCULAR | Status: AC
  Administered 2017-09-03: 80 mg via INTRAVENOUS

## 2017-09-03 MED ORDER — POTASSIUM CHLORIDE CRYS ER 20 MEQ PO TBCR
40.0000 meq | EXTENDED_RELEASE_TABLET | Freq: Two times a day (BID) | ORAL | Status: DC
  Administered 2017-09-03 (×2): 40 meq via ORAL
  Filled 2017-09-03 (×2): qty 2

## 2017-09-03 MED ORDER — FUROSEMIDE 10 MG/ML IJ SOLN
10.0000 mg/h | INTRAVENOUS | Status: DC
  Administered 2017-09-03: 10 mg/h via INTRAVENOUS
  Administered 2017-09-04 (×2): 20 mg/h via INTRAVENOUS
  Administered 2017-09-05: 15 mg/h via INTRAVENOUS
  Filled 2017-09-03: qty 25
  Filled 2017-09-03: qty 20
  Filled 2017-09-03: qty 25
  Filled 2017-09-03: qty 20
  Filled 2017-09-03 (×5): qty 25

## 2017-09-03 MED ORDER — NOREPINEPHRINE 4 MG/250ML-% IV SOLN
0.0000 ug/min | INTRAVENOUS | Status: DC
  Administered 2017-09-03: 4 ug/min via INTRAVENOUS
  Filled 2017-09-03 (×2): qty 250

## 2017-09-03 MED ORDER — FAMOTIDINE 20 MG PO TABS
20.0000 mg | ORAL_TABLET | Freq: Every day | ORAL | Status: DC
  Administered 2017-09-03 – 2017-09-11 (×9): 20 mg via ORAL
  Filled 2017-09-03 (×9): qty 1

## 2017-09-03 MED ORDER — FUROSEMIDE 10 MG/ML IJ SOLN: 80.0000 mg | Freq: Three times a day (TID) | INTRAMUSCULAR | Status: DC

## 2017-09-03 MED ORDER — ORAL CARE MOUTH RINSE
15.0000 mL | Freq: Two times a day (BID) | OROMUCOSAL | Status: DC
  Administered 2017-09-04 – 2017-09-06 (×5): 15 mL via OROMUCOSAL

## 2017-09-03 MED ORDER — CHLORHEXIDINE GLUCONATE 0.12 % MT SOLN
15.0000 mL | Freq: Two times a day (BID) | OROMUCOSAL | Status: DC
  Administered 2017-09-03 – 2017-09-07 (×8): 15 mL via OROMUCOSAL
  Filled 2017-09-03 (×5): qty 15

## 2017-09-03 MED ORDER — NOREPINEPHRINE 16 MG/250ML-% IV SOLN
0.0000 ug/min | INTRAVENOUS | Status: DC
  Administered 2017-09-03: 8 ug/min via INTRAVENOUS
  Filled 2017-09-03 (×2): qty 250

## 2017-09-03 MED ORDER — PHENYLEPHRINE HCL-NACL 10-0.9 MG/250ML-% IV SOLN
0.0000 ug/min | INTRAVENOUS | Status: AC
  Administered 2017-09-03: 20 ug/min via INTRAVENOUS
  Filled 2017-09-03: qty 250

## 2017-09-03 NOTE — Consult Note (Addendum)
Advanced Heart Failure Team Consult Note   Primary Physician: Kirstie Peri, MD PCP-Cardiologist:  Prentice Docker, MD  Reason for Consultation: RV Failure   HPI:    Emily [name] is seen today for evaluation of RV failure at the request of Dr Wyline Mood.    Emily [name] is an 84 year old with a history of morbid obesity,  A fib (not on AC), chronic respiratory failure on home oxygen, GERD, HTN,  and diastolic heart failure.   On 08/15/2017 she was seen in the office by Dr Purvis Sheffield. She was managed with torsemide 100 mg daily + metolazone 2.5 mg daily. Weight at that time was 267 pounds.   Admitted 08/31/17 with marked volume overload and A fib RVR.Pertinent admission labs included K 2.6, creatinine 1.4, Hgb 8.7, BNP 492, and troponin 0.03. CXR with vascular congestion. Hospital course complicated by hypotension. Initially she received IV fluids. IV fluids later stopped and IV lasix was started. Sluggish response to IV lasix. Started on milrinone for RV support. She has been on IV amio for A fib RVR.  Today she was seen by Dr Wyline Mood and due to ongoing hypotension and volume overload Neo was added. She is being transferred to [facility] for advacned heart failure consult. Weight has gone up 10 pounds since admit. She was receiving 60 mg IV lasix BID, but last dose was yesterday morning.   Sluggish UOP on milrinone. Weight unchanged. Lasix drip + metolazone added today. Neo switched to norepinephrine. Creatinine up to 1.36.  SOB is a little better today. Denies CP. She has a productive cough with brown sputum. Denies fever or chills.   Echo 09/01/2017 -EF 60-65% RV mildly dilated, RV volume overload, Peak PA pressure 34 mm hg.  She lives at home with her daughter and son in Social [place]. She does not smoke, drink alcohol, or do drugs.     Review of Systems: [y] = yes, [ ]  = no   General: Weight gain [ y]; Weight loss [ ] ; Anorexia [ ] ; Fatigue [ y]; Fever [ ] ; Chills [ ] ; Weakness [ ]   Cardiac: Chest  pain/pressure [ ] ; Resting SOB [ ] ; Exertional SOB [ y]; Orthopnea Emily [name] ]; Pedal Edema Emily [name] ]; Palpitations [ ] ; Syncope [ ] ; Presyncope [ ] ; Paroxysmal nocturnal dyspnea[ ]   Pulmonary: Cough [ ] ; Wheezing[ ] ; Hemoptysis[ ] ; Sputum [ ] ; Snoring [ ]   GI: Vomiting[ ] ; Dysphagia[ ] ; Melena[ ] ; Hematochezia [ ] ; Heartburn[ ] ; Abdominal pain [ ] ; Constipation [ ] ; Diarrhea [ ] ; BRBPR [ ]   GU: Hematuria[ ] ; Dysuria [ ] ; Nocturia[ ]   Vascular: Pain in legs with walking [ ] ; Pain in feet with lying flat [ ] ; Non-healing sores [ ] ; Stroke [ ] ; TIA [ ] ; Slurred speech [ ] ;  Neuro: Headaches[ ] ; Vertigo[ ] ; Seizures[ ] ; Paresthesias[ ] ;Blurred vision [ ] ; Diplopia [ ] ; Vision changes [ ]   Ortho/Skin: Arthritis [ y]; Joint pain [ y]; Muscle pain [ ] ; Joint swelling [ ] ; Back Pain [ ] ; Rash [ ]   Psych: Depression[ ] ; Anxiety[ ]   Heme: Bleeding problems [ ] ; Clotting disorders [ ] ; Anemia [ ]   Endocrine: Diabetes [ ] ; Thyroid dysfunction[ ]   Home Medications Prior to Admission medications   Medication Sig Start Date End Date Taking? Authorizing Provider  albuterol (PROVENTIL) (2.5 MG/3ML) 0.083% nebulizer solution USE ONE VIAL IN NEBULIZER FOUR TIMES DAILY AS NEEDED FOR SHORTNESS OF BREATH/WHEEZING 08/18/17  Yes [provider]  aspirin EC 81 MG tablet  Take 81 mg by mouth daily.   Yes [provider]  cetirizine (ZYRTEC) 10 MG tablet Take 10 mg by mouth daily.   Yes [provider]  diltiazem (CARDIZEM CD) 120 MG 24 hr capsule Take 120 mg by mouth 2 (two) times daily.   Yes [provider]  donepezil (ARICEPT) 5 MG tablet Take 5 mg by mouth every morning.    Yes [provider]  famotidine (PEPCID) 20 MG tablet Take 20 mg by mouth at bedtime.   Yes [provider]  Ferrous Fumarate (HEMOCYTE - 106 MG FE) 324 (106 Fe) MG TABS tablet Take 1 tablet by mouth every evening.    Yes [provider]  hydrocortisone (ANUSOL-HC) 2.5 % rectal cream Place 1  application rectally 2 (two) times daily. Patient taking differently: Place 1 application rectally 2 (two) times daily as needed for hemorrhoids or anal itching.  05/28/17  Yes Gelene Mink, NP  hydrocortisone (ANUSOL-HC) 25 MG suppository Place 1 suppository (25 mg total) rectally every 12 (twelve) hours. Patient taking differently: Place 25 mg rectally 2 (two) times daily as needed for hemorrhoids or anal itching.  07/31/17  Yes Gelene Mink, NP  lubiprostone (AMITIZA) 8 MCG capsule Take 8 mcg by mouth 2 (two) times daily with a meal.    Yes [provider]  metolazone (ZAROXOLYN) 2.5 MG tablet Take 2.5 mg by mouth every morning.   Yes [provider]  metoprolol (LOPRESSOR) 50 MG tablet Take 50 mg by mouth 2 (two) times daily.   Yes [provider]  montelukast (SINGULAIR) 10 MG tablet Take 10 mg by mouth every evening.   Yes [provider]  Multiple Vitamin (MULTIVITAMIN) tablet Take 1 tablet by mouth daily.   Yes [provider]  oxybutynin (DITROPAN) 5 MG tablet Take 5 mg by mouth daily.   Yes [provider]  potassium chloride SA (K-DUR,KLOR-CON) 20 MEQ tablet Take 10 mEq by mouth 2 (two) times daily.   Yes [provider]  torsemide (DEMADEX) 100 MG tablet Take 100 mg by mouth every morning.    Yes [provider]    Past Medical History: Past Medical History:  Diagnosis Date  . A-fib (HCC)   . CHF (congestive heart failure) (HCC)   . GERD (gastroesophageal reflux disease)   . HTN (hypertension)   . Hypertension     Past Surgical History: Past Surgical History:  Procedure Laterality Date  . ABDOMINAL HYSTERECTOMY    . BRONCHOSCOPY  2017   aspirated what was felt to be iron tablet, evaluated at [facility] with bronchs  . CESAREAN SECTION    . CHOLECYSTECTOMY    . HERNIA REPAIR      Family History: Family History  Problem Relation Age of Onset  . Colon cancer Neg Hx   . Colon polyps Neg Hx      Social History: Social History   Socioeconomic History  . Marital status: Married    Spouse name: Not on file  . Number of children: Not on file  . Years of education: Not on file  . Highest education level: Not on file  Occupational History  . Not on file  Social Needs  . Financial resource strain: Not on file  . Food insecurity:    Worry: Not on file    Inability: Not on file  . Transportation needs:    Medical: Not on file    Non-medical: Not on file  Tobacco Use  .  Smoking status: Never Smoker  . Smokeless tobacco: Never Used  Substance and Sexual Activity  . Alcohol use: No  . Drug use: No  . Sexual activity: Not on file  Lifestyle  . Physical activity:    Days per week: Not on file    Minutes per session: Not on file  . Stress: Not on file  Relationships  . Social connections:    Talks on phone: Not on file    Gets together: Not on file    Attends religious service: Not on file    Active member of club or organization: Not on file    Attends meetings of clubs or organizations: Not on file    Relationship status: Not on file  Other Topics Concern  . Not on file  Social History Narrative  . Not on file    Allergies:  Allergies  Allergen Reactions  . Sulfa Antibiotics Rash  . Quinine Derivatives Other (See Comments)    Bleeding from mouth. Affteced bone marrow. Decreased WBC.    Objective:    Vital Signs:   Temp:  [97.7 F (36.5 C)-98.8 F (37.1 C)] 98 F (36.7 C) (06/25 1551) Pulse Rate:  [104-138] 113 (06/25 1530) Resp:  [14-25] 17 (06/25 1530) BP: (82-129)/(37-94) 113/43 (06/25 1530) SpO2:  [90 %-99 %] 97 % (06/25 1530) Weight:  [270 lb 4.5 oz (122.6 kg)] 270 lb 4.5 oz (122.6 kg) (06/25 0500) Last BM Date: 09/01/17  Weight change: Filed Weights   09/01/17 0500 09/02/17 0500 09/03/17 0500  Weight: 268 lb 1.3 oz (121.6 kg) 270 lb 8.1 oz (122.7 kg) 270 lb 4.5 oz (122.6 kg)    Intake/Output:   Intake/Output Summary (Last 24 hours) at  09/03/2017 1603 Last data filed at 09/03/2017 1600 Gross per 24 hour  Intake 1195.64 ml  Output 600 ml  Net 595.64 ml      Physical Exam    General:  Drowsy. No resp difficulty HEENT: normal Neck: supple. JVP to ear. Carotids 2+ bilat; no bruits. No lymphadenopathy or thyromegaly appreciated. Cor: PMI nondisplaced. Tachy, IRR. No rubs, gallops or murmurs. Lungs: ronchi throughout. On 5 L [place] Abdomen: obese, soft, nontender, + distended. No hepatosplenomegaly. No bruits or masses. Good bowel sounds. Extremities: no cyanosis, clubbing, rash, +3 edema up thighs and abdomen. RUE TL PICC bleeding. Neuro: drowsy & orientedx3, cranial nerves grossly intact. moves all 4 extremities w/o difficulty. Affect pleasant   Telemetry   A fib 110-130s. Personally reviewed.   EKG    A Fib 68 bpm   Labs   Basic Metabolic Panel: Recent Labs  Lab 08/31/17 1530 09/01/17 0456 09/02/17 0506 09/03/17 0418  NA 139 142 142 142  K 2.6* 2.5* 3.4* 3.1*  CL 98* 100* 100* 100  CO2 32 33* 34* 35*  GLUCOSE 115* 98 116* 115*  BUN 62* 60* 60* 61*  CREATININE 1.40* 1.40* 1.32* 1.36*  CALCIUM 7.9* 7.9* 8.1* 8.1*  MG  --  2.3  --  2.2    Liver Function Tests: Recent Labs  Lab 08/31/17 1530 09/02/17 0506  AST 25 20  ALT 9* 9*  ALKPHOS 75 64  BILITOT 1.1 1.3*  PROT 5.7* 5.8*  ALBUMIN 2.4* 2.8*   No results for input(s): LIPASE, AMYLASE in the last 168 hours. No results for input(s): AMMONIA in the last 168 hours.  CBC: Recent Labs  Lab 08/31/17 1530 09/02/17 0506  WBC 5.1 6.8  NEUTROABS 3.3  --   HGB 8.7* 8.4*  HCT  28.1* 27.9*  MCV 89.2 92.1  PLT 206 189    Cardiac Enzymes: Recent Labs  Lab 08/31/17 1537  TROPONINI <0.03    BNP: BNP (last 3 results) Recent Labs    08/31/17 1537  BNP 492.0*    ProBNP (last 3 results) No results for input(s): PROBNP in the last 8760 hours.   CBG: Recent Labs  Lab 09/02/17 1728 09/03/17 1549  GLUCAP 111* 102*    Coagulation  Studies: No results for input(s): LABPROT, INR in the last 72 hours.   Imaging   Dg Chest Port 1 View  Result Date: 09/02/2017 CLINICAL DATA:  PICC line placement, increased shortness of breath. EXAM: PORTABLE CHEST 1 VIEW COMPARISON:  08/31/2017. FINDINGS: Cardiomegaly. Low lung volumes. Mild vascular congestion, borderline pulmonary edema. RIGHT arm PICC line lies at the cavoatrial junction. No pneumothorax. Thoracic atherosclerosis. IMPRESSION: RIGHT arm PICC line lies at the cavoatrial junction. Slight worsening aeration with low lung volumes, borderline pulmonary edema. Cardiomegaly. Electronically Signed   By: Elsie Stain M.D.   On: 09/02/2017 18:53     Medications:     Current Medications: . aspirin EC  81 mg Oral Daily  . Chlorhexidine Gluconate Cloth  6 each Topical Daily  . famotidine  20 mg Oral QHS  . feeding supplement (ENSURE ENLIVE)  237 mL Oral BID BM  . ipratropium  0.5 mg Nebulization Q6H  . levalbuterol  0.63 mg Nebulization Q6H  . metolazone  5 mg Oral Daily  . potassium chloride  40 mEq Oral BID  . sodium chloride flush  10-40 mL Intracatheter Q12H    Infusions: . amiodarone 30 mg/hr (09/03/17 0008)  . furosemide (LASIX) infusion 10 mg/hr (09/03/17 1558)  . heparin 1,150 Units/hr (09/03/17 0008)  . milrinone 0.125 mcg/kg/min (09/03/17 1147)  . norepinephrine (LEVOPHED) Adult infusion 8 mcg/min (09/03/17 1545)  . phenylephrine (NEO-SYNEPHRINE) Adult infusion 20 mcg/min (09/03/17 0947)      Patient Profile  Emily [name] is an 84 year old with a history of  A fib, chronic respiratory failure on home oxygen, GERD, HTN, and diastolic heart failure.   Admitted with marked volume overload and A fib RVR   Assessment/Plan   1. Shock--> Acute/Chronic RV Failure  - ECHO 09/01/17: EF 60-65%  - Marked volume overload.  - Increase lasix drip to 10 mg/hr + 5 mg metolazone today.  Give 80 mg IV lasix now.  - Check CO-OX now.  - Continue milrinone 0.125 mcg/kg/min  and norepinephrine @ 8 mcg/min. SBP 90s. - Set up CVP - Plan for RHC once fully diuresed - Add unna boots - She is a DNR  2. A fib RVR with hx of PAF - Rates 100-130s - Continue amio drip 30 mg per hour.  - Continue heparin drip.  - May need TEE/DCCV once diuresed. She is not on AC at home.   3. ?CKD 3 - Worsening renal function in the setting shock. Baseline unclear. Creatinine normal in 2017.  - Creatinine 1.36. Monitor daily BMET  4. Hypokalemia - Supp K aggressively.  - K 3.1. Supped with 40 meq BID  5. Chronic respiratory failure - On home O2 - On 5 L [place] right now - Had productive cough with brown sputum. Afebrile. No leukocytosis.  - CXR 6/24: Slight worsening aeration with low lung volumes, borderline pulmonary edema. Cardiomegaly.   Medication concerns reviewed with patient and pharmacy team. Barriers identified: N/A   Length of Stay: 3  Alford Highland, NP 09/03/2017, 4:03  PM  Advanced Heart Failure Team Pager [phone] (M-F; 7a - 4p)  Please contact [facility] for night-coverage after hours (4p -7a ) and weekends on [email or web address]   Patient seen and examined with the above-signed Advanced Practice Provider and/or Housestaff. I personally reviewed laboratory data, imaging studies and relevant notes. I independently examined the patient and formulated the important aspects of the plan. I have edited the note to reflect any of my changes or salient points. I have personally discussed the plan with the patient and/or family.  84 y/o woman with morbid obesity, chronic AF, diastolic HF and chronic respiratory failure due to presumed OHS transferred from [facility] due to recurrent RV failure, hypotension and massive volume overload. Consult requested by Dr. Wyline Mood to help manage RV failure.   According to her daughter she has severe HF exacerbations about every 2 years with marked volume overload which requires increasing her diuretics. At baseline patient is not that mobile but  able to ambulate the house with a walker. About 3 months ago began to have fluid retention and diuretics increased but did not work. About 2-3 weeks ago had a fall in the bathroom. Eventually admitted to [facility] with anasarca and hypotension. Weight up about 40 pounds (235 is baseline weight). Echo obtained and showed normal LV function with dilated RV with moderate RV hypokinesis with RV pressure volume overload. (Personally reviewed) Started on milrinone and neosynephrine without much improvement in urine output. Eventually neo switched to norepi and now on norepi at 8.  ABG 7.40/54/69/94%  On exam  Obese woman lying in bed. Weak and somewhat confused JVP to ear Cor tachy irregular +R lift 2/6 TR soft MR Ab obese + distended Extremities 3+ edema with severe chrnic venous stasis changes.   Co-ox 78% CVP 27 of PICC  Flo-trac device getting CI 10.4L   She has recurrent RV failure associated with ansarca and hypotension. Initially I though this was low output HF but co-ox seems to suggest more of a high-output state. This is confounded by the presence of two inotropes. 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 Will continue milrinone and NE for now. Increase lasix gtt to 20. Check ammonia. AF is chronic but rate up in setting of pressors. Now on amio for rate control. On heparin for AC.   I discussed with daughter and we agreed that we would treat aggressively for 48-72 hours and reassess. If making progress would eventually cosndier RHC. If not, then would switch to a more palliative.mode. She confirmed DNR/DNI.    CRITICAL CARE Performed by: Arvilla Meres  Total critical care time: 50 minutes  Critical care time was exclusive of separately billable procedures and treating other patients.  Critical care was necessary to treat or prevent imminent or life-threatening deterioration.  Critical care was time spent personally by me (independent of midlevel providers or residents) on the following  activities: development of treatment plan with patient and/or surrogate as well as nursing, discussions with consultants, evaluation of patient's response to treatment, examination of patient, obtaining history from patient or surrogate, ordering and performing treatments and interventions, ordering and review of laboratory studies, ordering and review of radiographic studies, pulse oximetry and re-evaluation of patient's condition.  Arvilla Meres, MD  7:03 PM

## 2017-09-03 NOTE — Progress Notes (Signed)
  ANTICOAGULATION CONSULT NOTE  Pharmacy Consult for  heparin dosing Indication: atrial fibrillation  Allergies  Allergen Reactions  . Sulfa Antibiotics Rash  . Quinine Derivatives Other (See Comments)    Bleeding from mouth. Affteced bone marrow. Decreased WBC.   Patient Measurements: Height: 5\' 4"  (162.6 cm) Weight: 270 lb 4.5 oz (122.6 kg) IBW/kg (Calculated) : 54.7 Heparin Dosing Weight: HEPARIN DW (KG): 83.9  Vital Signs: Temp: 98.2 F (36.8 C) (06/25 0800) Temp Source: Oral (06/25 0800) BP: 89/46 (06/25 0800) Pulse Rate: 112 (06/25 0800)  Labs: Recent Labs    08/31/17 1530 08/31/17 1537 09/01/17 0456 09/02/17 0506 09/02/17 1950 09/03/17 0418  HGB 8.7*  --   --  8.4*  --   --   HCT 28.1*  --   --  27.9*  --   --   PLT 206  --   --  189  --   --   HEPARINUNFRC  --   --   --   --  0.65 0.59  CREATININE 1.40*  --  1.40* 1.32*  --  1.36*  TROPONINI  --  <0.03  --   --   --   --    Estimated Creatinine Clearance: 39.8 mL/min (A) (by C-G formula based on SCr of 1.36 mg/dL (H)).  Assessment: [age] yof with atrial fibrillation admitted with leg swelling and chronic diastolic/RV failure CHF.    Heparin level therapeutic at 0.59  Goal of Therapy:   Heparin level 0.3-0.7 IU/mL   Plan:  Continue heparin infusion at 1150 units/hr Check anti-Xa level daily while on heparin Continue to monitor H&H and platelets   Tad MooreSteven C Jesseka Drinkard 09/03/2017,8:41 AM

## 2017-09-03 NOTE — Progress Notes (Signed)
Called report to RN at :[place]. She was still questioning what room the patient was going to and they agreed to room 18. [facility] and Daughter carried belongings.

## 2017-09-03 NOTE — Progress Notes (Addendum)
PROGRESS NOTE    Emily [name]  ZOX:[id number] DOB: 1931-07-19 DOA: 08/31/2017 PCP: Kirstie Peri, MD    Brief Narrative:  84 year old female with a history of CHF, admitted to the hospital with volume overload. Found to have significant right sided heart failure with concomitant low blood pressures. Seen by cardiology and is on IV diuretics. Milrinone also added to heart failure regimen. She has a history of A fib, and developed RVR during hospital stay. Currently on amiodarone infusion and heparin infusion. She is critically ill.  Assessment & Plan:   Principal Problem:   Hypotension Active Problems:   Hypertension   CHF (congestive heart failure) (HCC)   GERD (gastroesophageal reflux disease)   A-fib (HCC)   Hypokalemia   Hypoalbuminemia  1. Acute on chronic right sided congestive heart failure. Patient was admitted with worsening edema and that was not responding to increasing doses of oral diuretics.  She was noted to be hypotensive on arrival.  Echo performed showed that RV was severely dilated with severe TR and RV dysfunction.  Cardiology following, appreciate assistance. PICC line has been placed. Following CVP readings. Follow Cooxemetry panel. Started on milrinone. Continue IV lasix per cardiology service.  2. Persistent atrial fibrillation.  Currently has rapid ventricular response. Started on amiodarone infusion due to tachycardia and soft blood pressures. Also started on heparin infusion for anticoagulation. Follow hemoglobin 3. Anemia. Unclear what baseline hemoglobin is. Continue to monitor with on going anticoagulation. No signs of bleeding at this time. 4. Acute resp failure with hypoxia. Related to CHF exacerbation. Since she has had more cough, will repeat chest xray 5. Lethargy. Slightly improved.  Abg shows mildly elevated PCO2, but pH is normal indicating chronic process. No signs of infection. CBG normal. Hemodynamic instability (low bp) may be playing a role. She did  receive ativan overnight that may be contributing. Continue to follow. 6. HTN.  She is chronically on metoprolol and diltiazem, which are currently on hold due to relative low blood pressures 7. GERD.  Continue Protonix 8. Hypoalbuminemia.  Continue protein supplements per dietitian.   9. Hypokalemia. Oral replacement ordered. Follow BMP, Magnesium.    DVT prophylaxis: heparin infusion Code Status: DNR Family Communication: Discussed with daughter at the bedside Disposition Plan: ongoing critical care treatment needed  ** Addendum:   I spoke with Dr. Wyline Mood.  Pt needs to transfer to [facility] to be seen by Heart Failure Team.  I spoke with Dr. Molli Knock of PCCM service who agreed to take on to his service.  Transfer orders placed.     Consultants:   cardiology  Procedures:  Echo: - Left ventricle: The cavity size was normal. Wall thickness was   normal. Systolic function was normal. The estimated ejection   fraction was in the range of 60% to 65%. Wall motion was normal;   there were no regional wall motion abnormalities. The study is   not technically sufficient to allow evaluation of LV diastolic   function. - Ventricular septum: The contour showed diastolic flattening, RV   volume overload. - Aortic valve: Valve mobility was restricted. There was trivial   regurgitation. Peak velocity (S): 224 cm/s. - Left atrium: The atrium was mildly dilated. Volume/bsa, ES   (1-plane Simpson&'s, A4C): 37.2 ml/m^2. - Right ventricle: The cavity size was mildly dilated. Wall   thickness was normal. - Right atrium: The atrium was moderately dilated. - Tricuspid valve: There was moderate-severe regurgitation. - Pulmonary arteries: Systolic pressure was mildly increased. PA  peak pressure: 34 mm Hg (S).    Antimicrobials:       Subjective Daughter says patient is much more alert this morning.   Objective: Vitals:   09/03/17 0545 09/03/17 0600 09/03/17 0615 09/03/17 0630  BP: (!)  88/46 (!) 92/37 (!) 97/48 (!) 101/46  Pulse: (!) 114 (!) 118 (!) 122 (!) 119  Resp: 15 16 15 16   Temp:      TempSrc:      SpO2: 95% 95% 92% 95%  Weight:      Height:        Intake/Output Summary (Last 24 hours) at 09/03/2017 0645 Last data filed at 09/03/2017 0400 Gross per 24 hour  Intake 1192.83 ml  Output 450 ml  Net 742.83 ml   Filed Weights   09/01/17 0500 09/02/17 0500 09/03/17 0500  Weight: 121.6 kg (268 lb 1.3 oz) 122.7 kg (270 lb 8.1 oz) 122.6 kg (270 lb 4.5 oz)    Examination:  General exam: awake, alert, NAD, Respiratory system: rare crackles at bases. Mildly increased resp effort Cardiovascular system: irregular. No murmurs, rubs, gallops. Gastrointestinal system: Abdomen is nondistended, soft and nontender. No organomegaly or masses felt. Normal bowel sounds heard. Central nervous system:  No focal neurological deficits. Extremities: 3+ bilateral pitting edema Skin: venous stasis changes in lower extremities bilaterally Psychiatry: normal affect.    Data Reviewed: I have personally reviewed following labs and imaging studies  CBC: Recent Labs  Lab 08/31/17 1530 09/02/17 0506  WBC 5.1 6.8  NEUTROABS 3.3  --   HGB 8.7* 8.4*  HCT 28.1* 27.9*  MCV 89.2 92.1  PLT 206 189   Basic Metabolic Panel: Recent Labs  Lab 08/31/17 1530 09/01/17 0456 09/02/17 0506 09/03/17 0418  NA 139 142 142 142  K 2.6* 2.5* 3.4* 3.1*  CL 98* 100* 100* 100  CO2 32 33* 34* 35*  GLUCOSE 115* 98 116* 115*  BUN 62* 60* 60* 61*  CREATININE 1.40* 1.40* 1.32* 1.36*  CALCIUM 7.9* 7.9* 8.1* 8.1*  MG  --  2.3  --   --    GFR: Estimated Creatinine Clearance: 39.8 mL/min (A) (by C-G formula based on SCr of 1.36 mg/dL (H)). Liver Function Tests: Recent Labs  Lab 08/31/17 1530 09/02/17 0506  AST 25 20  ALT 9* 9*  ALKPHOS 75 64  BILITOT 1.1 1.3*  PROT 5.7* 5.8*  ALBUMIN 2.4* 2.8*   No results for input(s): LIPASE, AMYLASE in the last 168 hours. No results for input(s):  AMMONIA in the last 168 hours. Coagulation Profile: No results for input(s): INR, PROTIME in the last 168 hours. Cardiac Enzymes: Recent Labs  Lab 08/31/17 1537  TROPONINI <0.03   BNP (last 3 results) No results for input(s): PROBNP in the last 8760 hours. HbA1C: No results for input(s): HGBA1C in the last 72 hours. CBG: Recent Labs  Lab 09/02/17 1728  GLUCAP 111*   Lipid Profile: No results for input(s): CHOL, HDL, LDLCALC, TRIG, CHOLHDL, LDLDIRECT in the last 72 hours. Thyroid Function Tests: No results for input(s): TSH, T4TOTAL, FREET4, T3FREE, THYROIDAB in the last 72 hours. Anemia Panel: No results for input(s): VITAMINB12, FOLATE, FERRITIN, TIBC, IRON, RETICCTPCT in the last 72 hours. Sepsis Labs: No results for input(s): PROCALCITON, LATICACIDVEN in the last 168 hours.  Recent Results (from the past 240 hour(s))  MRSA PCR Screening     Status: None   Collection Time: 08/31/17  9:24 PM  Result Value Ref Range Status   MRSA by PCR NEGATIVE  NEGATIVE Final    Comment:        The GeneXpert MRSA Assay (FDA approved for NASAL specimens only), is one component of a comprehensive MRSA colonization surveillance program. It is not intended to diagnose MRSA infection nor to guide or monitor treatment for MRSA infections. Performed at [facility], [address], [place], [place] [address]     Radiology Studies: Dg Chest [facility] 1 View  Result Date: 09/02/2017 CLINICAL DATA:  PICC line placement, increased shortness of breath. EXAM: PORTABLE CHEST 1 VIEW COMPARISON:  08/31/2017. FINDINGS: Cardiomegaly. Low lung volumes. Mild vascular congestion, borderline pulmonary edema. RIGHT arm PICC line lies at the cavoatrial junction. No pneumothorax. Thoracic atherosclerosis. IMPRESSION: RIGHT arm PICC line lies at the cavoatrial junction. Slight worsening aeration with low lung volumes, borderline pulmonary edema. Cardiomegaly. Electronically Signed   By: Elsie Stain M.D.   On:  09/02/2017 18:53   Scheduled Meds: . aspirin EC  81 mg Oral Daily  . Chlorhexidine Gluconate Cloth  6 each Topical Daily  . feeding supplement (ENSURE ENLIVE)  237 mL Oral BID BM  . furosemide  60 mg Intravenous BID  . ipratropium  0.5 mg Nebulization Q6H  . levalbuterol  0.63 mg Nebulization Q6H  . potassium chloride  40 mEq Oral BID  . sodium chloride flush  10-40 mL Intracatheter Q12H   Continuous Infusions: . amiodarone 30 mg/hr (09/03/17 0008)  . heparin 1,150 Units/hr (09/03/17 0008)  . milrinone 0.125 mcg/kg/min (09/02/17 1950)     LOS: 3 days    Critical care time: 37 mins, discussing management with patient, daughter, reviewing chest xrays and ekgs, as well as coordinating care with cardiology  Standley Dakins, MD [facility] Pager [phone]  If 7PM-7AM, please contact night-coverage [email or web address] Password [email or web address] 09/03/2017, 6:45 AM

## 2017-09-03 NOTE — H&P (Addendum)
PULMONARY / CRITICAL CARE MEDICINE   Name: Emily [name] MRN: [id number] DOB: Aug 07, 1931    ADMISSION DATE:  08/31/2017 CONSULTATION DATE:  09/03/2017  REFERRING MD:  Dr. Purvis Sheffield  CHIEF COMPLAINT:  Shortness of breath, LE edema  HISTORY OF PRESENT ILLNESS:   Emily [name] is a 84 year old female with a past medical history of Diastolic Heart failure, A. Fib, chronic respiratory failure on home O2, GERD, hypertension.  She was admitted to [facility] on 08/31/17 with complaints of increased leg swelling, weight increase, and weakness.  She is followed by her cardiologist Dr. Purvis Sheffield.  It is noted that she has gained approximately 30 pounds over the past several weeks.  Initial ED evaluation at [facility] revealed BNP of 400, chest x-ray with mild CHF, acute renal failure with creatinine of 1.4 (baseline creatinine 0.5-1 in 2017), and hypotension. While at [facility], she was treated for right-sided heart failure and volume overload that was poorly responsive to IV diuretics and AKI.  Course was complicated by hypotension requiring Milrinone and Neo-Synehprine infusions, as well as A-Fib w/ RVR requiring Amiodarone and Heparin infusions.   She was transferred to [facility] on 09/03/17 for further evaluation and management by the Heart Failure team.  PCCM is asked to admit patient.  PAST MEDICAL HISTORY :  She  has a past medical history of A-fib (HCC), CHF (congestive heart failure) (HCC), GERD (gastroesophageal reflux disease), HTN (hypertension), and Hypertension.  PAST SURGICAL HISTORY: She  has a past surgical history that includes Cesarean section; Abdominal hysterectomy; Hernia repair; Cholecystectomy; and Bronchoscopy (2017).  Allergies  Allergen Reactions  . Sulfa Antibiotics Rash  . Quinine Derivatives Other (See Comments)    Bleeding from mouth. Affteced bone marrow. Decreased WBC.    No current facility-administered medications on file prior to encounter.    Current  Outpatient Medications on File Prior to Encounter  Medication Sig  . albuterol (PROVENTIL) (2.5 MG/3ML) 0.083% nebulizer solution USE ONE VIAL IN NEBULIZER FOUR TIMES DAILY AS NEEDED FOR SHORTNESS OF BREATH/WHEEZING  . aspirin EC 81 MG tablet Take 81 mg by mouth daily.  . cetirizine (ZYRTEC) 10 MG tablet Take 10 mg by mouth daily.  Marland Kitchen diltiazem (CARDIZEM CD) 120 MG 24 hr capsule Take 120 mg by mouth 2 (two) times daily.  Marland Kitchen donepezil (ARICEPT) 5 MG tablet Take 5 mg by mouth every morning.   . famotidine (PEPCID) 20 MG tablet Take 20 mg by mouth at bedtime.  . Ferrous Fumarate (HEMOCYTE - 106 MG FE) 324 (106 Fe) MG TABS tablet Take 1 tablet by mouth every evening.   . hydrocortisone (ANUSOL-HC) 2.5 % rectal cream Place 1 application rectally 2 (two) times daily. (Patient taking differently: Place 1 application rectally 2 (two) times daily as needed for hemorrhoids or anal itching. )  . hydrocortisone (ANUSOL-HC) 25 MG suppository Place 1 suppository (25 mg total) rectally every 12 (twelve) hours. (Patient taking differently: Place 25 mg rectally 2 (two) times daily as needed for hemorrhoids or anal itching. )  . lubiprostone (AMITIZA) 8 MCG capsule Take 8 mcg by mouth 2 (two) times daily with a meal.   . metolazone (ZAROXOLYN) 2.5 MG tablet Take 2.5 mg by mouth every morning.  . metoprolol (LOPRESSOR) 50 MG tablet Take 50 mg by mouth 2 (two) times daily.  . montelukast (SINGULAIR) 10 MG tablet Take 10 mg by mouth every evening.  . Multiple Vitamin (MULTIVITAMIN) tablet Take 1 tablet by mouth daily.  Marland Kitchen oxybutynin (DITROPAN) 5  MG tablet Take 5 mg by mouth daily.  . potassium chloride SA (K-DUR,KLOR-CON) 20 MEQ tablet Take 10 mEq by mouth 2 (two) times daily.  Marland Kitchen. torsemide (DEMADEX) 100 MG tablet Take 100 mg by mouth every morning.     FAMILY HISTORY:  Her indicated that the status of her neg hx is unknown.   SOCIAL HISTORY: She  reports that she has never smoked. She has never used smokeless  tobacco. She reports that she does not drink alcohol or use drugs.  REVIEW OF SYSTEMS:   Positives in BOLD: Gen: Denies fever, chills, weight change, fatigue, night sweats HEENT: Denies blurred vision, double vision, hearing loss, tinnitus, sinus congestion, rhinorrhea, sore throat, neck stiffness, dysphagia PULM: Denies +shortness of breath, +cough, sputum production, hemoptysis, wheezing CV: Denies chest pain, +edema, orthopnea, paroxysmal nocturnal dyspnea, palpitations GI: Denies abdominal pain, nausea, vomiting, diarrhea, hematochezia, melena, constipation, change in bowel habits GU: Denies dysuria, hematuria, polyuria, oliguria, urethral discharge Endocrine: Denies hot or cold intolerance, polyuria, polyphagia or appetite change Derm: Denies rash, dry skin, scaling or peeling skin change Heme: Denies easy bruising, bleeding, bleeding gums Neuro: Denies headache, numbness, weakness, slurred speech, loss of memory or consciousness   SUBJECTIVE:  Reports Shortness of breath, LE edema Afebrile Dry Cough Hypotensive, requiring Neo-synephrine gtt  VITAL SIGNS: BP (!) 98/50   Pulse (!) 123   Temp 98.8 F (37.1 C) (Oral)   Resp 15   Ht 5\' 4"  (1.626 m)   Wt 270 lb 4.5 oz (122.6 kg)   SpO2 97%   BMI 46.39 kg/m   HEMODYNAMICS: CVP:  [7 mmHg-24 mmHg] 24 mmHg  VENTILATOR SETTINGS:    INTAKE / OUTPUT: I/O last 3 completed shifts: In: 1906.5 [I.V.:642.8; IV Piggyback:1263.7] Out: 1250 [Urine:1250]  PHYSICAL EXAMINATION: General:  Acute on chronically ill appearing obese female, laying in bed, on 4L [place], in mild respiratory distress Neuro:  Awake, alert, disoriented to time and situation HEENT:  Atraumatic, normocephalic, neck supple, JVD, MM pink/moist Cardiovascular:  Tachycardia, irregularly irregular rhythm, 3/6 systolic murmur Lungs:  Diminished breath sounds bilaterally, even, slight increase in work of breathing Abdomen:  Obese, Soft, non-tender, non-distended,  BS+x4 Musculoskeletal:  3+ bilateral pitting edema Skin:  Warm, dry.  Venous stasis changes bilateral LE  LABS:  BMET Recent Labs  Lab 09/01/17 0456 09/02/17 0506 09/03/17 0418  NA 142 142 142  K 2.5* 3.4* 3.1*  CL 100* 100* 100  CO2 33* 34* 35*  BUN 60* 60* 61*  CREATININE 1.40* 1.32* 1.36*  GLUCOSE 98 116* 115*    Electrolytes Recent Labs  Lab 09/01/17 0456 09/02/17 0506 09/03/17 0418  CALCIUM 7.9* 8.1* 8.1*  MG 2.3  --  2.2    CBC Recent Labs  Lab 08/31/17 1530 09/02/17 0506  WBC 5.1 6.8  HGB 8.7* 8.4*  HCT 28.1* 27.9*  PLT 206 189    Coag's No results for input(s): APTT, INR in the last 168 hours.  Sepsis Markers No results for input(s): LATICACIDVEN, PROCALCITON, O2SATVEN in the last 168 hours.  ABG Recent Labs  Lab 09/02/17 1015 09/02/17 2055  PHART 7.407 7.401  PCO2ART 52.9* 54.4*  PO2ART 63.7* 68.5*    Liver Enzymes Recent Labs  Lab 08/31/17 1530 09/02/17 0506  AST 25 20  ALT 9* 9*  ALKPHOS 75 64  BILITOT 1.1 1.3*  ALBUMIN 2.4* 2.8*    Cardiac Enzymes Recent Labs  Lab 08/31/17 1537  TROPONINI <0.03    Glucose Recent Labs  Lab 09/02/17 1728  GLUCAP 111*    Imaging Dg Chest Port 1 View  Result Date: 09/02/2017 CLINICAL DATA:  PICC line placement, increased shortness of breath. EXAM: PORTABLE CHEST 1 VIEW COMPARISON:  08/31/2017. FINDINGS: Cardiomegaly. Low lung volumes. Mild vascular congestion, borderline pulmonary edema. RIGHT arm PICC line lies at the cavoatrial junction. No pneumothorax. Thoracic atherosclerosis. IMPRESSION: RIGHT arm PICC line lies at the cavoatrial junction. Slight worsening aeration with low lung volumes, borderline pulmonary edema. Cardiomegaly. Electronically Signed   By: Elsie Stain M.D.   On: 09/02/2017 18:53     STUDIES:  ECHO 09/01/17>> LV EF 60-65%.  Unable to evaluate LV diastolic function. Ventricular septum with diastolic flattening consistent with RV overload. Right atrium and right  ventricle mildly dilated.  PA peak pressure 34 mm Hg.  Moderate-severe Mitral valve regurgitation.  CULTURES:  ANTIBIOTICS:   SIGNIFICANT EVENTS: 6/22 Admission to [facility] 6/25>> Transfer to [facility]   LINES/TUBES: 6/24>> R arm PICC>>  DISCUSSION: 84 year old female with severe volume overload and suspicion for Acute on Chronic Right-sided heart failure, hypotension, AKI, and A-fib w/RVR requiring Lasix gtt, Milrinone gtt, Amiodarone gtt, &Neo-synephrine gtt.  ASSESSMENT / PLAN:  PULMONARY A: Acute Hypoxic Respiratory Failure in setting of Acute on Chronic CHF P:   Supplemental O2 to maintain O2 sats >92%, wean as tolerated Continue Bronchodilators IV diuresis per Cardiology/Heart Failure team F/u CXR in am 6/26  CARDIOVASCULAR A:  Acute on Chronic Right-sided Heart Failure -ECHO 6/22>>LV EF 60-65%, ventricular septum with diastolic flattening consistent with RV overload. Right atrium and right ventricle mildly dilated.  PA peak pressure 34 mm Hg Cardiogenic shock in setting of CHF A-fib w/RVR P:  Cardiology/Heart Failure team consulted, appreciate input IV Lasix gtt per Cardiology/Heart Failure team Neo-synephrine gtt, will transition to Levophed gtt Maintain MAP >65 Continue Milrinone gtt Continue Amiodarone gtt Continue Heparin gtt for anticoagulation (A-fib) Give one time dose Metolazone  RENAL A:   AKI Hypokalemia P:   Monitor I&O's / urine output Trend BMP F/u BMP in pm 6/25 Ensure adequate renal perfusion Avoid Nephrotoxic agents as able Replace electrolytes as indicated  GASTROINTESTINAL A:   No active issues Hx: GERD P:   Heart healthy diet Resume home Pepcid  HEMATOLOGIC A:   Anemia P:  Monitor for s/sx of bleeding Trend CBC Heparin gtt per pharmacy for VTE / anticoagulation (A-fib) Transfuse for Hgb<7  INFECTIOUS A:   No active issues P:   Monitor fever curve Trend WBC's  ENDOCRINE A:   No active issues   P:   Check  TSH Follow Glucose on BMP's Initiate SSI if indicated Follow ICU Hypo/hyperglycemia protocol  NEUROLOGIC A:   Hx: Dementia P:   Provide supportive care Lights on during day Avoid sedating meds as able   FAMILY  - Updates: No family present at bedside 09/03/17.  Pt is DNR.  - Inter-disciplinary family meet or Palliative Care meeting due by:  09/10/2017   Harlon Ditty, AGACNP-BC [place] [facility]  09/03/2017, 3:02 PM  Attending Note:  84 year old female with diastolic heart failure, OSA, OHV, pulmonary HTN and cardiogenic shock who presents to outside hospital with hypoxemia, hypotension and anasarca.  The patient was seen by cardiology and it was recommended that patient be transferred to the ICU in [facility] for advanced heart failure to evaluate her.  On exam, diffuse anasarca with diffuse cardiac wheezes.  I reviewed CXR myself, diffuse pulmonary edema and low volumes.  Discussed with PCCM-NP and CHF-MD.  Will admit to the ICU.  Change pressors from neo to levo.  Begin a lasix drip with zaroxolyn.  Strict I/O.  Daily weights.  Consult palliative care for realistic goals of care, patient is currently DNR.  PCCM will continue to follow.  HF team to evaluate.  The patient is critically ill with multiple organ systems failure and requires high complexity decision making for assessment and support, frequent evaluation and titration of therapies, application of advanced monitoring technologies and extensive interpretation of multiple databases.   Critical Care Time devoted to patient care services described in this note is  45  Minutes. This time reflects time of care of this signee Dr Koren Bound. This critical care time does not reflect procedure time, or teaching time or supervisory time of PA/NP/Med student/Med Resident etc but could involve care discussion time.  Alyson Reedy, M.D. [facility] Pulmonary/Critical Care Medicine. Pager: [phone]. After hours pager:  [phone].

## 2017-09-03 NOTE — Progress Notes (Addendum)
Progress Note  Patient Name: Emily HumphreysDoris J [name] Date of Encounter: 09/03/2017  Primary Cardiologist: Prentice DockerSuresh Koneswaran, MD   Subjective   No events overnight.   Inpatient Medications    Scheduled Meds: . aspirin EC  81 mg Oral Daily  . Chlorhexidine Gluconate Cloth  6 each Topical Daily  . feeding supplement (ENSURE ENLIVE)  237 mL Oral BID BM  . furosemide  60 mg Intravenous BID  . ipratropium  0.5 mg Nebulization Q6H  . levalbuterol  0.63 mg Nebulization Q6H  . potassium chloride  40 mEq Oral BID  . sodium chloride flush  10-40 mL Intracatheter Q12H   Continuous Infusions: . amiodarone 30 mg/hr (09/03/17 0008)  . heparin 1,150 Units/hr (09/03/17 0008)  . milrinone 0.125 mcg/kg/min (09/02/17 1950)   PRN Meds: acetaminophen **OR** acetaminophen, guaiFENesin-dextromethorphan, levalbuterol, ondansetron **OR** ondansetron (ZOFRAN) IV, sodium chloride flush   Vital Signs    Vitals:   09/03/17 0600 09/03/17 0615 09/03/17 0630 09/03/17 0645  BP: (!) 92/37 (!) 97/48 (!) 101/46 (!) 93/46  Pulse: (!) 118 (!) 122 (!) 119 (!) 114  Resp: 16 15 16 15   Temp:      TempSrc:      SpO2: 95% 92% 95% 95%  Weight:      Height:        Intake/Output Summary (Last 24 hours) at 09/03/2017 0753 Last data filed at 09/03/2017 0400 Gross per 24 hour  Intake 1192.83 ml  Output 450 ml  Net 742.83 ml   Filed Weights   09/01/17 0500 09/02/17 0500 09/03/17 0500  Weight: 268 lb 1.3 oz (121.6 kg) 270 lb 8.1 oz (122.7 kg) 270 lb 4.5 oz (122.6 kg)    Telemetry    afib 100-110 - Personally Reviewed  ECG    na  Physical Exam   GEN: No acute distress.   Neck: JVD to ear lobe.  Cardiac: irreg, 3/6 systolic murmur LLSB, no rubs, or gallops.  Respiratory: Clear to auscultation bilaterally. GI: Soft, nontender, non-distended  MS: No edema; No deformity. Neuro:  Nonfocal  Psych: Normal affect   Labs    Chemistry Recent Labs  Lab 08/31/17 1530 09/01/17 0456 09/02/17 0506  09/03/17 0418  NA 139 142 142 142  K 2.6* 2.5* 3.4* 3.1*  CL 98* 100* 100* 100  CO2 32 33* 34* 35*  GLUCOSE 115* 98 116* 115*  BUN 62* 60* 60* 61*  CREATININE 1.40* 1.40* 1.32* 1.36*  CALCIUM 7.9* 7.9* 8.1* 8.1*  PROT 5.7*  --  5.8*  --   ALBUMIN 2.4*  --  2.8*  --   AST 25  --  20  --   ALT 9*  --  9*  --   ALKPHOS 75  --  64  --   BILITOT 1.1  --  1.3*  --   GFRNONAA 33* 33* 36* 35*  GFRAA 39* 39* 42* 40*  ANIONGAP 9 9 8 7      Hematology Recent Labs  Lab 08/31/17 1530 09/02/17 0506  WBC 5.1 6.8  RBC 3.15* 3.03*  HGB 8.7* 8.4*  HCT 28.1* 27.9*  MCV 89.2 92.1  MCH 27.6 27.7  MCHC 31.0 30.1  RDW 14.9 14.9  PLT 206 189    Cardiac Enzymes Recent Labs  Lab 08/31/17 1537  TROPONINI <0.03   No results for input(s): TROPIPOC in the last 168 hours.   BNP Recent Labs  Lab 08/31/17 1537  BNP 492.0*     DDimer No results for input(s): DDIMER  in the last 168 hours.   Radiology    Dg Chest Port 1 View  Result Date: 09/02/2017 CLINICAL DATA:  PICC line placement, increased shortness of breath. EXAM: PORTABLE CHEST 1 VIEW COMPARISON:  08/31/2017. FINDINGS: Cardiomegaly. Low lung volumes. Mild vascular congestion, borderline pulmonary edema. RIGHT arm PICC line lies at the cavoatrial junction. No pneumothorax. Thoracic atherosclerosis. IMPRESSION: RIGHT arm PICC line lies at the cavoatrial junction. Slight worsening aeration with low lung volumes, borderline pulmonary edema. Cardiomegaly. Electronically Signed   By: Elsie Stain M.D.   On: 09/02/2017 18:53    Cardiac Studies    Patient Profile     84 yo female history of PAF, LE edema, mild to low normal LV systolic function, chronic diastolic HF. She reports side effects on anticoag in the past but unsure of details, discussing further with primary cardiologist whether to start at next follow up. . Admitted with increased LE dema, weight gain (30 lbs over 3-4 months per family). Found to be hypotensive in ER with  AKI. Historically has been on high dose diuretics at home, despite aggressive regimen worsening LE edema and weight gain.     Assessment & Plan    1. Acute on chronic right sided heart failure - 25 lbs weight gain since 05/2017. Did not respond to aggressive diuretics at home, presented with AKI and hypotensive - Echo with normal LVEF, cannot eval diastolic function though probably abnormal. RV function reported as normal, mod to severe TR, PASP 34, with flattened septum suggesting RV volume overload. Albumin 2.8. I reviewed echo, RV somewhat poorly visualized. Looks to be severely dilated. At least moderate dysfunction despite tissue anular velocity of 10(no TAPSE done). Systolic hepatic flow reversal would suggest severe TR. PASP misleading with severe TR and RV dysfunction. Her clinical course and echo would support severe right sided heart failure not responding to diuresis.   - poor response to IV diuretics yesterday. PICC line placed, started on milrinone. Heart rates unchanged 100-110 since starting drip.  - ordered co-ox panel prior to milrinone but has not been done. Reorder this AM. Initial CVP 22 (follow up cvps in epic are inaccurate as these have not been balanced) - I/Os incomplete, night shift not documented.  - continue milrinone today. Renal function stable. Change IV lasix to 80mg  tid.   2. Afib - afib with RVR, difficult to manage due to soft bp's - started on amiodarone drip. Soft bp's, lowered to 30mg /hr. Pending bp's today may rebolus.  - with RV failure would like to restore sinus rhythm if possible. Continue amio. At her state do not think she would tolerate TEE for DCCV, she had not been on anticoag at home  3. Hypotension - appears to be due to RV failure, poor LV loading and aggressive diuretic use - on milrinone. MAPS low this AM, start neosynephrine, would avoid levophed or dopamine given her tachycardia   Would recommend transfer to [facility] for further  evaluation and management by CHF team. I have discussed with primary team and patient's family, patient to be transferred to medicine team to a stepdown bed, CHF to consult     For questions or updates, please contact [facility] Please consult [email or web address] for contact info under Cardiology/STEMI.      Joanie Coddington, MD  09/03/2017, 7:53 AM

## 2017-09-04 ENCOUNTER — Inpatient Hospital Stay (HOSPITAL_COMMUNITY): Payer: Medicare Other

## 2017-09-04 DIAGNOSIS — J9621 Acute and chronic respiratory failure with hypoxia: Secondary | ICD-10-CM

## 2017-09-04 LAB — AMMONIA: Ammonia: 34 umol/L (ref 9–35)

## 2017-09-04 LAB — BASIC METABOLIC PANEL
Anion gap: 8 (ref 5–15)
BUN: 56 mg/dL — ABNORMAL HIGH (ref 8–23)
CHLORIDE: 95 mmol/L — AB (ref 98–111)
CO2: 36 mmol/L — ABNORMAL HIGH (ref 22–32)
CREATININE: 1.25 mg/dL — AB (ref 0.44–1.00)
Calcium: 8.2 mg/dL — ABNORMAL LOW (ref 8.9–10.3)
GFR, EST AFRICAN AMERICAN: 44 mL/min — AB (ref >60)
GFR, EST NON AFRICAN AMERICAN: 38 mL/min — AB (ref >60)
Glucose, Bld: 152 mg/dL — ABNORMAL HIGH (ref 70–99)
POTASSIUM: 3 mmol/L — AB (ref 3.5–5.1)
SODIUM: 139 mmol/L (ref 135–145)

## 2017-09-04 LAB — COOXEMETRY PANEL
Carboxyhemoglobin: 2.4 % — ABNORMAL HIGH (ref 0.5–1.5)
Methemoglobin: 1 % (ref 0.0–1.5)
O2 SAT: 94.7 %
Total hemoglobin: 8.1 g/dL — ABNORMAL LOW (ref 12.0–16.0)

## 2017-09-04 LAB — IRON AND TIBC
IRON: 21 ug/dL — AB (ref 28–170)
Saturation Ratios: 7 % — ABNORMAL LOW (ref 10.4–31.8)
TIBC: 312 ug/dL (ref 250–450)
UIBC: 291 ug/dL

## 2017-09-04 LAB — FERRITIN: Ferritin: 3 ng/mL — ABNORMAL LOW (ref 11–307)

## 2017-09-04 LAB — BASIC METABOLIC PANEL WITH GFR
Anion gap: 10 (ref 5–15)
BUN: 57 mg/dL — ABNORMAL HIGH (ref 8–23)
CO2: 33 mmol/L — ABNORMAL HIGH (ref 22–32)
Calcium: 8.3 mg/dL — ABNORMAL LOW (ref 8.9–10.3)
Chloride: 97 mmol/L — ABNORMAL LOW (ref 98–111)
Creatinine, Ser: 1.29 mg/dL — ABNORMAL HIGH (ref 0.44–1.00)
GFR calc Af Amer: 43 mL/min — ABNORMAL LOW
GFR calc non Af Amer: 37 mL/min — ABNORMAL LOW
Glucose, Bld: 169 mg/dL — ABNORMAL HIGH (ref 70–99)
Potassium: 3.3 mmol/L — ABNORMAL LOW (ref 3.5–5.1)
Sodium: 140 mmol/L (ref 135–145)

## 2017-09-04 LAB — RETICULOCYTES
RBC.: 2.9 MIL/uL — ABNORMAL LOW (ref 3.87–5.11)
RETIC CT PCT: 2.1 % (ref 0.4–3.1)
Retic Count, Absolute: 60.9 10*3/uL (ref 19.0–186.0)

## 2017-09-04 LAB — CBC
HCT: 27.1 % — ABNORMAL LOW (ref 36.0–46.0)
Hemoglobin: 8.2 g/dL — ABNORMAL LOW (ref 12.0–15.0)
MCH: 27.4 pg (ref 26.0–34.0)
MCHC: 30.3 g/dL (ref 30.0–36.0)
MCV: 90.6 fL (ref 78.0–100.0)
Platelets: 200 10*3/uL (ref 150–400)
RBC: 2.99 MIL/uL — ABNORMAL LOW (ref 3.87–5.11)
RDW: 14.6 % (ref 11.5–15.5)
WBC: 8 10*3/uL (ref 4.0–10.5)

## 2017-09-04 LAB — T4, FREE: FREE T4: 1.19 ng/dL (ref 0.82–1.77)

## 2017-09-04 LAB — HEPARIN LEVEL (UNFRACTIONATED): Heparin Unfractionated: 0.43 [IU]/mL (ref 0.30–0.70)

## 2017-09-04 LAB — VITAMIN B12: VITAMIN B 12: 759 pg/mL (ref 180–914)

## 2017-09-04 LAB — MAGNESIUM: Magnesium: 2.2 mg/dL (ref 1.7–2.4)

## 2017-09-04 LAB — URIC ACID: Uric Acid, Serum: 15.5 mg/dL — ABNORMAL HIGH (ref 2.5–7.1)

## 2017-09-04 LAB — TSH: TSH: 6.096 u[IU]/mL — ABNORMAL HIGH (ref 0.350–4.500)

## 2017-09-04 LAB — FOLATE: Folate: 16.8 ng/mL (ref >5.9)

## 2017-09-04 MED ORDER — DIGOXIN 125 MCG PO TABS
0.1250 mg | ORAL_TABLET | Freq: Every day | ORAL | Status: DC
  Administered 2017-09-04 – 2017-09-12 (×9): 0.125 mg via ORAL
  Filled 2017-09-04 (×10): qty 1

## 2017-09-04 MED ORDER — POTASSIUM CHLORIDE 20 MEQ/15ML (10%) PO SOLN
40.0000 meq | Freq: Two times a day (BID) | ORAL | Status: DC
  Administered 2017-09-04 (×2): 40 meq via ORAL
  Filled 2017-09-04 (×2): qty 30

## 2017-09-04 MED ORDER — SODIUM CHLORIDE 0.9 % IV SOLN
510.0000 mg | Freq: Once | INTRAVENOUS | Status: AC
  Administered 2017-09-04: 510 mg via INTRAVENOUS
  Filled 2017-09-04 (×2): qty 17

## 2017-09-04 MED ORDER — LEVALBUTEROL HCL 0.63 MG/3ML IN NEBU
0.6300 mg | INHALATION_SOLUTION | Freq: Three times a day (TID) | RESPIRATORY_TRACT | Status: DC
  Administered 2017-09-04 – 2017-09-06 (×5): 0.63 mg via RESPIRATORY_TRACT
  Filled 2017-09-04 (×5): qty 3

## 2017-09-04 MED ORDER — IPRATROPIUM BROMIDE 0.02 % IN SOLN
0.5000 mg | Freq: Three times a day (TID) | RESPIRATORY_TRACT | Status: DC
  Administered 2017-09-04 – 2017-09-06 (×5): 0.5 mg via RESPIRATORY_TRACT
  Filled 2017-09-04 (×5): qty 2.5

## 2017-09-04 MED ORDER — POTASSIUM CHLORIDE 20 MEQ/15ML (10%) PO SOLN
40.0000 meq | Freq: Once | ORAL | Status: AC
  Administered 2017-09-04: 40 meq via ORAL
  Filled 2017-09-04: qty 30

## 2017-09-04 MED ORDER — FENTANYL CITRATE (PF) 100 MCG/2ML IJ SOLN
25.0000 ug | Freq: Once | INTRAMUSCULAR | Status: AC
  Administered 2017-09-04: 25 ug via INTRAVENOUS
  Filled 2017-09-04: qty 2

## 2017-09-04 MED ORDER — ALLOPURINOL 300 MG PO TABS
300.0000 mg | ORAL_TABLET | Freq: Every day | ORAL | Status: DC
  Administered 2017-09-04 – 2017-09-07 (×4): 300 mg via ORAL
  Filled 2017-09-04 (×4): qty 1

## 2017-09-04 NOTE — Progress Notes (Signed)
PULMONARY / CRITICAL CARE MEDICINE   Name: Emily [name] MRN: [id number] DOB: 06/26/1931    ADMISSION DATE:  08/31/2017 CONSULTATION DATE:  09/03/2017  REFERRING MD:  Dr. Purvis SheffieldKoneswaran  CHIEF COMPLAINT:  Shortness of breath, LE edema  HISTORY OF PRESENT ILLNESS:   Emily [name] is a 84 year old female with a past medical history of Diastolic Heart failure, A. Fib, chronic respiratory failure on home O2, GERD, hypertension.  She was admitted to [facility] on 08/31/17 with complaints of increased leg swelling, weight increase, and weakness.  She is followed by her cardiologist Dr. Purvis SheffieldKoneswaran.  It is noted that she has gained approximately 30 pounds over the past several weeks.  Initial ED evaluation at [facility] revealed BNP of 400, chest x-ray with mild CHF, acute renal failure with creatinine of 1.4 (baseline creatinine 0.5-1 in 2017), and hypotension. While at [facility], she was treated for right-sided heart failure and volume overload that was poorly responsive to IV diuretics and AKI.  Course was complicated by hypotension requiring Milrinone and Neo-Synehprine infusions, as well as A-Fib w/ RVR requiring Amiodarone and Heparin infusions.   She was [facility] on 09/03/17 for further evaluation and management by the Heart Failure team.  PCCM is asked to admit patient.   SUBJECTIVE:  Feels better. No distress. Wants to get out of bed.   VITAL SIGNS: Blood Pressure (Abnormal) 92/50   Pulse (Abnormal) 127   Temperature 98.3 F (36.8 C)   Respiration 14   Height 5\' 4"  (1.626 m)   Weight 271 lb 6.2 oz (123.1 kg)   Oxygen Saturation 92%   Body Mass Index 46.58 kg/m  5 liters  HEMODYNAMICS: CVP:  [21 mmHg-24 mmHg] 21 mmHg  VENTILATOR SETTINGS:    INTAKE / OUTPUT: I/O last 3 completed shifts: In: 2366.3 [P.O.:270; I.V.:2086.3; Other:10] Out: 2890 [Urine:2890]  PHYSICAL EXAMINATION: General  84 year old white female. Resting in bed. No acute distress.  HENT  NCAT. No clear JVD MMM pulm decreased bases no accessory use  Card AF + systolic murmur  abd obese + bowel sounds no OM Ext Marked chronic lower extremity pitting edema  GU clear yellow via FC Neuro awake and alert no focal def. Confused at baseline  LABS:  BMET Recent Labs  Lab 09/03/17 0418 09/03/17 1958 09/04/17 0426  NA 142 138 140  K 3.1* 2.9* 3.3*  CL 100 95* 97*  CO2 35* 34* 33*  BUN 61* 57* 57*  CREATININE 1.36* 1.35* 1.29*  GLUCOSE 115* 214* 169*    Electrolytes Recent Labs  Lab 09/01/17 0456  09/03/17 0418 09/03/17 1958 09/04/17 0426  CALCIUM 7.9*   < > 8.1* 8.2* 8.3*  MG 2.3  --  2.2  --  2.2   < > = values in this interval not displayed.    CBC Recent Labs  Lab 08/31/17 1530 09/02/17 0506 09/04/17 0426  WBC 5.1 6.8 8.0  HGB 8.7* 8.4* 8.2*  HCT 28.1* 27.9* 27.1*  PLT 206 189 200    Coag's No results for input(s): APTT, INR in the last 168 hours.  Sepsis Markers No results for input(s): LATICACIDVEN, PROCALCITON, O2SATVEN in the last 168 hours.  ABG Recent Labs  Lab 09/02/17 1015 09/02/17 2055  PHART 7.407 7.401  PCO2ART 52.9* 54.4*  PO2ART 63.7* 68.5*    Liver Enzymes Recent Labs  Lab 08/31/17 1530 09/02/17 0506  AST 25 20  ALT 9* 9*  ALKPHOS 75 64  BILITOT 1.1 1.3*  ALBUMIN 2.4* 2.8*    Cardiac Enzymes Recent Labs  Lab 08/31/17 1537  TROPONINI <0.03    Glucose Recent Labs  Lab 09/02/17 1728 09/03/17 1549  GLUCAP 111* 102*    Imaging Dg Chest Port 1 View  Result Date: 09/04/2017 CLINICAL DATA:  Acute respiratory failure EXAM: PORTABLE CHEST 1 VIEW COMPARISON:  09/02/2017 FINDINGS: Cardiac shadow remains enlarged. Right-sided PICC line is noted in satisfactory position. Mild bibasilar atelectatic changes are noted. Mild vascular congestion is again seen. IMPRESSION: Mild vascular congestion and bibasilar atelectasis. Electronically Signed   By: Alcide Clever M.D.   On: 09/04/2017 08:07     STUDIES:  ECHO  09/01/17>> LV EF 60-65%.  Unable to evaluate LV diastolic function. Ventricular septum with diastolic flattening consistent with RV overload. Right atrium and right ventricle mildly dilated.  PA peak pressure 34 mm Hg.  Moderate-severe Mitral valve regurgitation.  CULTURES:  ANTIBIOTICS:   SIGNIFICANT EVENTS: 6/22 Admission to [facility] 6/25>> Transfer to [facility]   LINES/TUBES: 6/24>> R arm PICC>>  DISCUSSION: 84 year old female with severe volume overload and suspicion for Acute on Chronic Right-sided heart failure, hypotension, AKI, and A-fib w/RVR requiring Lasix gtt, Milrinone gtt, Amiodarone gtt, &Neo-synephrine gtt.  ASSESSMENT / PLAN:  Acute Hypoxic Respiratory Failure in setting of Acute on Chronic CHF Pcxr: persistent pulmonary edema. No sig change  Plan Wean oxygen Cont Diuresis   Acute on Chronic Right-sided Heart Failure -ECHO 6/22>>LV EF 60-65%, ventricular septum with diastolic flattening consistent with RV overload. Right atrium and right ventricle mildly dilated.  PA peak pressure 34 mm Hg Cardiogenic shock in setting of CHF A-fib w/RVR Plan Cont inotrope and vasoactive gtts per cards Cont IV lasix and metolazone  amio per cards Cont heparin gtt.  Cont asa   AKI-->improving Plan Cont to maximize CO Cont Diuresis (see above) Renal dose meds Strict intake and output   Fluid and electrolyte imbalance ->hypokalemia  Plan Replace and recheck  Anemia hgb stable/ no evidence of bleeding. Normocytic/normochromic  Plan  H/o GERD Plan Cont PPI  Hx: Dementia Plan Supportive care    FAMILY  - Updates: No family present at bedside 09/03/17.  Pt is DNR.  - Inter-disciplinary family meet or Palliative Care meeting due by:  09/10/2017   Simonne Martinet ACNP-BC [facility] Pulmonary/Critical Care Pager # [phone] OR # [phone] if no answer  09/04/2017, 9:49 AM

## 2017-09-04 NOTE — Progress Notes (Signed)
Orthopedic Tech Progress Note Patient Details:  [name] 04/28/1931 [id number]  Ortho Devices Type of Ortho Device: Radio broadcast assistantUnna boot Ortho Device/Splint Location: bilateral Ortho Device/Splint Interventions: Application   Post Interventions Patient Tolerated: Well Instructions Provided: Care of device   Nikki DomCrawford, [name] 09/04/2017, 10:40 AM

## 2017-09-04 NOTE — Progress Notes (Signed)
eLink Physician-Brief Progress Note Patient Name: Emily [name] DOB: 10/18/1931 MRN: [id number]   Date of Service  09/04/2017  HPI/Events of Note  K+ = 3.3 and Creatinine = 1.29. Patient already has K+ replacement ordered.   eICU Interventions  Continue present management.      Intervention Category Major Interventions: Electrolyte abnormality - evaluation and management  Sommer,Steven Eugene 09/04/2017, 6:47 AM

## 2017-09-04 NOTE — Progress Notes (Addendum)
  ANTICOAGULATION CONSULT NOTE  Pharmacy Consult for  heparin dosing Indication: atrial fibrillation  Allergies  Allergen Reactions  . Sulfa Antibiotics Rash  . Quinine Derivatives Other (See Comments)    Bleeding from mouth. Affteced bone marrow. Decreased WBC.   Patient Measurements: Height: 5\' 4"  (162.6 cm) Weight: 271 lb 6.2 oz (123.1 kg) IBW/kg (Calculated) : 54.7 HEPARIN DW (KG): 83.9  Vital Signs: Temp: 98.7 F (37.1 C) (06/26 0331) Temp Source: Oral (06/26 0331) BP: 122/65 (06/26 0700) Pulse Rate: 118 (06/26 0700)  Labs: Recent Labs    09/02/17 0506 09/02/17 1950 09/03/17 0418 09/03/17 1958 09/04/17 0426  HGB 8.4*  --   --   --  8.2*  HCT 27.9*  --   --   --  27.1*  PLT 189  --   --   --  200  HEPARINUNFRC  --  0.65 0.59  --  0.43  CREATININE 1.32*  --  1.36* 1.35* 1.29*   Estimated Creatinine Clearance: 42.1 mL/min (A) (by C-G formula based on SCr of 1.29 mg/dL (H)).  Assessment: [age] yof with atrial fibrillation admitted with leg swelling and chronic diastolic/RV failure CHF, started on milrinone and transferred from [facility].   Heparin level came back therapeutic at 0.43, on 1150 units/hr. Hgb 8.2, plt 200. No s/sx of bleeding- small nose bleed earlier and nursing did notice slight bleeding when brushing teeth. No reported infusion issues.   Goal of Therapy:   Heparin level 0.3-0.7 IU/mL   Plan:  Continue heparin infusion at 1150 units/hr Check anti-Xa level daily while on heparin Continue to monitor H&H and platelets   Girard CooterKimberly Perkins, PharmD Clinical Pharmacist  Pager: [phone] Phone: [phone] 09/04/2017,8:10 AM

## 2017-09-04 NOTE — Progress Notes (Addendum)
Advanced Heart Failure Rounding Note  PCP-Cardiologist: Prentice DockerSuresh Koneswaran, MD   Subjective:    Brisk UOP with lasix drip @ 20 mg/hr and metolazone. Weight is down 6 lbs. Creatinine stable 1.29, K 3.3. CVP 18  Remains on norepinephrine (at 10 this am) and milrinone @ 0.125 mcg/kg/ min. Co-ox ?94%  Remains in Afib 100-110s on amio @ 30 and heparin drip. Hemoglobin 8.4 -> 8.2. Anemia panel pending.   TSH elevated to 6. T4 and T3 pending. Uric acid elevated to 15.   More alert this morning. Denies CP. SOB is a little improved.   Objective:   Weight Range: 271 lb 6.2 oz (123.1 kg) Body mass index is 46.58 kg/m.   Vital Signs:   Temp:  [98 F (36.7 C)-98.8 F (37.1 C)] 98.7 F (37.1 C) (06/26 0331) Pulse Rate:  [104-138] 106 (06/26 0600) Resp:  [13-29] 15 (06/26 0600) BP: (89-129)/(38-64) 89/47 (06/26 0600) SpO2:  [91 %-99 %] 96 % (06/26 0600) Arterial Line BP: (90-154)/(51-96) 90/51 (06/26 0600) Weight:  [271 lb 6.2 oz (123.1 kg)-277 lb 1.9 oz (125.7 kg)] 271 lb 6.2 oz (123.1 kg) (06/26 0423) Last BM Date: 09/01/17  Weight change: Filed Weights   09/03/17 0500 09/03/17 1745 09/04/17 0423  Weight: 270 lb 4.5 oz (122.6 kg) 277 lb 1.9 oz (125.7 kg) 271 lb 6.2 oz (123.1 kg)    Intake/Output:   Intake/Output Summary (Last 24 hours) at 09/04/2017 0725 Last data filed at 09/04/2017 0600 Gross per 24 hour  Intake 1962.41 ml  Output 2890 ml  Net -927.59 ml      Physical Exam    CVP 18 General:  Drowsy. No resp difficulty HEENT: Normal Neck: Supple. JVP to ear. Carotids 2+ bilat; no bruits. No lymphadenopathy or thyromegaly appreciated. Cor: PMI nondisplaced. Tachy, irregular. 2/6 TR, soft MR + RV lift. Lungs: Clear Abdomen: obese, soft, nontender, +distended. No hepatosplenomegaly. No bruits or masses. Good bowel sounds. Extremities: No cyanosis, clubbing, rash, 3+ edema into thighs and abdomen. RU TL PICC bleeding.  Neuro: Alert & orientedx3, cranial nerves  grossly intact. moves all 4 extremities w/o difficulty. Affect pleasant   Telemetry   Afib 110s. Personally reviewed  EKG    No new tracings.   Labs    CBC Recent Labs    09/02/17 0506 09/04/17 0426  WBC 6.8 8.0  HGB 8.4* 8.2*  HCT 27.9* 27.1*  MCV 92.1 90.6  PLT 189 200   Basic Metabolic Panel Recent Labs    16/12/9604/25/19 0418 09/03/17 1958 09/04/17 0426  NA 142 138 140  K 3.1* 2.9* 3.3*  CL 100 95* 97*  CO2 35* 34* 33*  GLUCOSE 115* 214* 169*  BUN 61* 57* 57*  CREATININE 1.36* 1.35* 1.29*  CALCIUM 8.1* 8.2* 8.3*  MG 2.2  --  2.2   Liver Function Tests Recent Labs    09/02/17 0506  AST 20  ALT 9*  ALKPHOS 64  BILITOT 1.3*  PROT 5.8*  ALBUMIN 2.8*   No results for input(s): LIPASE, AMYLASE in the last 72 hours. Cardiac Enzymes No results for input(s): CKTOTAL, CKMB, CKMBINDEX, TROPONINI in the last 72 hours.  BNP: BNP (last 3 results) Recent Labs    08/31/17 1537  BNP 492.0*    ProBNP (last 3 results) No results for input(s): PROBNP in the last 8760 hours.   D-Dimer No results for input(s): DDIMER in the last 72 hours. Hemoglobin A1C No results for input(s): HGBA1C in the last 72 hours. Fasting  Lipid Panel No results for input(s): CHOL, HDL, LDLCALC, TRIG, CHOLHDL, LDLDIRECT in the last 72 hours. Thyroid Function Tests Recent Labs    09/04/17 0426  TSH 6.096*    Other results:   Imaging     No results found.   Medications:     Scheduled Medications: . aspirin EC  81 mg Oral Daily  . chlorhexidine  15 mL Mouth Rinse BID  . Chlorhexidine Gluconate Cloth  6 each Topical Daily  . famotidine  20 mg Oral QHS  . feeding supplement (ENSURE ENLIVE)  237 mL Oral BID BM  . ipratropium  0.5 mg Nebulization Q6H  . levalbuterol  0.63 mg Nebulization Q6H  . mouth rinse  15 mL Mouth Rinse q12n4p  . potassium chloride  40 mEq Oral BID  . sodium chloride flush  10-40 mL Intracatheter Q12H     Infusions: . amiodarone 30 mg/hr  (09/04/17 0600)  . furosemide (LASIX) infusion 20 mg/hr (09/04/17 0600)  . heparin 1,150 Units/hr (09/04/17 0600)  . milrinone 0.125 mcg/kg/min (09/04/17 0600)  . norepinephrine (LEVOPHED) Adult infusion 10 mcg/min (09/04/17 0600)     PRN Medications:  acetaminophen **OR** acetaminophen, guaiFENesin-dextromethorphan, levalbuterol, ondansetron **OR** ondansetron (ZOFRAN) IV, sodium chloride, sodium chloride flush    Patient Profile   Emily [name] is an 84 year old with a history of  A fib, chronic respiratory failure on home oxygen, GERD, HTN, dementia, and diastolic heart failure.   Admitted with marked volume overload and A fib RVR  Assessment/Plan   1. Shock--> Acute/Chronic RV Failure - ?High output state - ECHO 09/01/17: EF 60-65%  - Marked volume overload. Good UOP with IV lasix, but she is getting a lot of IVF with drips - Continue lasix drip 20 mg/hr. Consider metolazone again today.   - CVP 18. Flo-trac shows Co 8.7, CI 3.9, and SVRI 1385 - Continue milrinone 0.125 mcg/kg/min and norepinephrine @ 10 mcg/min. SBP 90-110s. Coox 95% - Add unna boots - She is a DNR/I - Plan to treat aggressively over next 2-3 days. If she improves, consider RHC. If she does not improve, would move toward palliative.  - Unable to concentrate any of her drips - discussed with PharmD  2. A fib RVR with hx of PAF - Rates 100-110s - Continue amio drip 30 mg per hour.  - Continue heparin drip.  - May need TEE/DCCV once diuresed. She is not on AC at home.   3. ?CKD 3 - Improving with diuresis - Creatinine 1.29. Monitor daily BMET  4. Hypokalemia - K 3.3 this am. Supplemented by primary  5. Chronic respiratory failure - On home 2 L O2 - Remains on 5 L [place] - Has productive cough with brown sputum. Afebrile. No leukocytosis.  - CXR 6/24: Slight worsening aeration with low lung volumes, borderline pulmonary edema. Cardiomegaly. Repeat CXR pending this am.   6. Dementia/AMS - Ammonia  normal 34 - More alert this morning.   7. Gout - Uric acid elevated 15.5. Consider colchicine vs prednisone  8. Hypothyroidism - TSH elevated to 6. Check T3 and T4  9. Anemia - Hemoglobin 8.2 this am. Check anemia panel.   Medication concerns reviewed with patient and pharmacy team. Barriers identified: cognitive  Length of Stay: 4  Alford Highland, NP  09/04/2017, 7:25 AM  Advanced Heart Failure Team Pager [phone] (M-F; 7a - 4p)  Please contact [facility] for night-coverage after hours (4p -7a ) and weekends on [email or web address]  Agree with above.  She is now on NE 10 and milrinone 0.125 to support her BP. Remains very fatigued but mental status improving. Now diuresing well. Still markedly volume overloaded. Co-ox suggests high output HF. K low. Creatinine improving. Remains ibn rapid AF despite IV amio. On heparin.   On exam Chronically ill appearing. Weak but alert JVP to jaw Cor IRR tach 2/6 TR +RV lift  Lungs CTA Ab obese distended Ext 3+ edema. + UNNA boots   She remains very tenuous. Now on NE and milrinone. Urine output and BP improved. Will continue. Co-ox suggests high cardiac output. Will add midodrine and attempt to wean NE. Continue amio and heparin for rapid AF (AF is chronic). Add digoxin. Prognosis is guarded. Will eventually need RHC if improves enough.   CRITICAL CARE Performed by: Arvilla Meres  Total critical care time: 35 minutes  Critical care time was exclusive of separately billable procedures and treating other patients.  Critical care was necessary to treat or prevent imminent or life-threatening deterioration.  Critical care was time spent personally by me (independent of midlevel providers or residents) on the following activities: development of treatment plan with patient and/or surrogate as well as nursing, discussions with consultants, evaluation of patient's response to treatment, examination of patient, obtaining history from patient  or surrogate, ordering and performing treatments and interventions, ordering and review of laboratory studies, ordering and review of radiographic studies, pulse oximetry and re-evaluation of patient's condition.  Arvilla Meres, MD  11:00 PM

## 2017-09-04 NOTE — Progress Notes (Signed)
eLink Physician-Brief Progress Note Patient Name: Val EagleDoris J Pitz DOB: 09/29/1931 MRN: [id number]   Date of Service  09/04/2017  HPI/Events of Note  K+ = 2.9 and Creatinine = 1.35.  eICU Interventions  Will replace K+.     Intervention Category Major Interventions: Electrolyte abnormality - evaluation and management  Sommer,Steven Eugene 09/04/2017, 12:43 AM

## 2017-09-04 NOTE — Plan of Care (Signed)
  Problem: Health Behavior/Discharge Planning: Goal: Ability to manage health-related needs will improve Outcome: Progressing   Problem: Cardiac: Goal: Ability to achieve and maintain adequate cardiopulmonary perfusion will improve Outcome: Progressing Note:  Patient continues to diurese on lasix gtt- putting out about 247m/hr from foley. CVP now 21.    Problem: Education: Goal: Knowledge of General Education information will improve Outcome: Completed/Met

## 2017-09-05 LAB — BASIC METABOLIC PANEL
Anion gap: 6 (ref 5–15)
Anion gap: 11 (ref 5–15)
BUN: 54 mg/dL — ABNORMAL HIGH (ref 8–23)
BUN: 52 mg/dL — AB (ref 8–23)
CHLORIDE: 96 mmol/L — AB (ref 98–111)
CO2: 36 mmol/L — ABNORMAL HIGH (ref 22–32)
CO2: 34 mmol/L — ABNORMAL HIGH (ref 22–32)
Calcium: 8.1 mg/dL — ABNORMAL LOW (ref 8.9–10.3)
Calcium: 8.5 mg/dL — ABNORMAL LOW (ref 8.9–10.3)
Chloride: 96 mmol/L — ABNORMAL LOW (ref 98–111)
Creatinine, Ser: 1.15 mg/dL — ABNORMAL HIGH (ref 0.44–1.00)
Creatinine, Ser: 1.18 mg/dL — ABNORMAL HIGH (ref 0.44–1.00)
GFR calc Af Amer: 49 mL/min — ABNORMAL LOW (ref >60)
GFR calc Af Amer: 48 mL/min — ABNORMAL LOW (ref >60)
GFR calc non Af Amer: 42 mL/min — ABNORMAL LOW (ref >60)
GFR calc non Af Amer: 41 mL/min — ABNORMAL LOW (ref >60)
GLUCOSE: 147 mg/dL — AB (ref 70–99)
Glucose, Bld: 124 mg/dL — ABNORMAL HIGH (ref 70–99)
POTASSIUM: 3.6 mmol/L (ref 3.5–5.1)
Potassium: 2.9 mmol/L — ABNORMAL LOW (ref 3.5–5.1)
Sodium: 138 mmol/L (ref 135–145)
Sodium: 141 mmol/L (ref 135–145)

## 2017-09-05 LAB — COOXEMETRY PANEL
Carboxyhemoglobin: 1.8 % — ABNORMAL HIGH (ref 0.5–1.5)
METHEMOGLOBIN: 1.9 % — AB (ref 0.0–1.5)
O2 SAT: 84.3 %
Total hemoglobin: 8.3 g/dL — ABNORMAL LOW (ref 12.0–16.0)

## 2017-09-05 LAB — MAGNESIUM: Magnesium: 1.9 mg/dL (ref 1.7–2.4)

## 2017-09-05 LAB — GLUCOSE, CAPILLARY: Glucose-Capillary: 116 mg/dL — ABNORMAL HIGH (ref 70–99)

## 2017-09-05 LAB — CBC
HCT: 27 % — ABNORMAL LOW (ref 36.0–46.0)
Hemoglobin: 8 g/dL — ABNORMAL LOW (ref 12.0–15.0)
MCH: 26.8 pg (ref 26.0–34.0)
MCHC: 29.6 g/dL — ABNORMAL LOW (ref 30.0–36.0)
MCV: 90.6 fL (ref 78.0–100.0)
Platelets: 217 10*3/uL (ref 150–400)
RBC: 2.98 MIL/uL — AB (ref 3.87–5.11)
RDW: 14.5 % (ref 11.5–15.5)
WBC: 6.5 10*3/uL (ref 4.0–10.5)

## 2017-09-05 LAB — T3, FREE: T3 FREE: 1.8 pg/mL — AB (ref 2.0–4.4)

## 2017-09-05 LAB — HEPARIN LEVEL (UNFRACTIONATED): HEPARIN UNFRACTIONATED: 0.32 [IU]/mL (ref 0.30–0.70)

## 2017-09-05 MED ORDER — OXYCODONE HCL 5 MG PO TABS
ORAL_TABLET | ORAL | Status: AC
  Administered 2017-09-05: 5 mg via ORAL
  Filled 2017-09-05: qty 1

## 2017-09-05 MED ORDER — SODIUM CHLORIDE 0.9 % IV SOLN
INTRAVENOUS | Status: DC
  Administered 2017-09-05: 09:00:00 via INTRAVENOUS

## 2017-09-05 MED ORDER — POTASSIUM CHLORIDE CRYS ER 20 MEQ PO TBCR: 40.0000 meq | EXTENDED_RELEASE_TABLET | Freq: Once | ORAL | Status: DC

## 2017-09-05 MED ORDER — COLCHICINE 0.6 MG PO TABS
ORAL_TABLET | ORAL | Status: AC
  Administered 2017-09-05: 0.6 mg via ORAL
  Filled 2017-09-05: qty 1

## 2017-09-05 MED ORDER — COLCHICINE 0.6 MG PO TABS
0.6000 mg | ORAL_TABLET | Freq: Two times a day (BID) | ORAL | Status: AC
  Administered 2017-09-05 – 2017-09-07 (×6): 0.6 mg via ORAL
  Filled 2017-09-05 (×5): qty 1

## 2017-09-05 MED ORDER — OXYCODONE HCL 5 MG PO TABS
5.0000 mg | ORAL_TABLET | Freq: Four times a day (QID) | ORAL | Status: DC | PRN
  Administered 2017-09-05 – 2017-09-06 (×5): 5 mg via ORAL
  Filled 2017-09-05 (×4): qty 1

## 2017-09-05 MED ORDER — MAGNESIUM SULFATE 2 GM/50ML IV SOLN
2.0000 g | Freq: Once | INTRAVENOUS | Status: AC
  Administered 2017-09-05: 2 g via INTRAVENOUS
  Filled 2017-09-05: qty 50

## 2017-09-05 MED ORDER — METOLAZONE 5 MG PO TABS
5.0000 mg | ORAL_TABLET | Freq: Two times a day (BID) | ORAL | Status: DC
  Administered 2017-09-05 – 2017-09-12 (×14): 5 mg via ORAL
  Filled 2017-09-05 (×15): qty 1

## 2017-09-05 MED ORDER — POTASSIUM CHLORIDE 20 MEQ/15ML (10%) PO SOLN
40.0000 meq | Freq: Four times a day (QID) | ORAL | Status: DC
  Administered 2017-09-05 – 2017-09-07 (×10): 40 meq via ORAL
  Filled 2017-09-05 (×9): qty 30

## 2017-09-05 MED ORDER — AMIODARONE IV BOLUS ONLY 150 MG/100ML: 150.0000 mg | Freq: Once | INTRAVENOUS | Status: DC

## 2017-09-05 NOTE — Progress Notes (Addendum)
Acute/chronic r heart failure, hypotension, aki, afib with rvr, conts on lasix drip, milrinone drip, amio, and neo, heparin plan is snf when stable per ot eval.

## 2017-09-05 NOTE — Progress Notes (Addendum)
Advanced Heart Failure Rounding Note  PCP-Cardiologist: Prentice DockerSuresh Koneswaran, MD   Subjective:    Brisk UOP with lasix drip @ 20 mg/hr with -2.8 L out. Weight is down 1 more lb (bed weights). Creatinine improved 1.15, K 2.9. CVP 15-16  Remains on norepinephrine (at 4 this am) and milrinone @ 0.125 mcg/kg/ min. Co-ox 84%  Remains in Afib 100-110s on amio @ 30 and heparin drip.   Hemoglobin 8.4 -> 8.2 -> 8.0. Received ferraheme yesterday.    Treated for ?gout flare in feet overnight with colchicine and oxycodone.   Drowsy this am, but just got up to chair. Denies CP or dizziness. Still has orthopnea.   Objective:   Weight Range: 270 lb 8.1 oz (122.7 kg) Body mass index is 46.43 kg/m.   Vital Signs:   Temp:  [97.7 F (36.5 C)-98.3 F (36.8 C)] 98.1 F (36.7 C) (06/27 0409) Pulse Rate:  [117-136] 119 (06/27 0600) Resp:  [13-24] 14 (06/27 0600) BP: (92-128)/(50-86) 108/59 (06/27 0600) SpO2:  [90 %-96 %] 95 % (06/27 0600) Arterial Line BP: (77-167)/(39-84) 95/59 (06/26 1900) Weight:  [270 lb 8.1 oz (122.7 kg)] 270 lb 8.1 oz (122.7 kg) (06/27 0500) Last BM Date: 09/01/17  Weight change: Filed Weights   09/03/17 1745 09/04/17 0423 09/05/17 0500  Weight: 277 lb 1.9 oz (125.7 kg) 271 lb 6.2 oz (123.1 kg) 270 lb 8.1 oz (122.7 kg)    Intake/Output:   Intake/Output Summary (Last 24 hours) at 09/05/2017 0707 Last data filed at 09/05/2017 0600 Gross per 24 hour  Intake 1977.82 ml  Output 4800 ml  Net -2822.18 ml      Physical Exam    CVP 15-16 General: Drowsy. No resp difficulty. HEENT: Normal Neck: Supple. JVP to jaw. Carotids 2+ bilat; no bruits. No thyromegaly or nodule noted. Cor: PMI nondisplaced. Tachy, irregular, 2/6 TR, soft MR + RV lift Lungs: CTAB, normal effort. Abdomen: Soft, non-tender, non-distended, no HSM. No bruits or masses. +BS  Extremities: No cyanosis, clubbing, or rash. R and LLE 2-3+ edema into thighs. RUE PICC CDI Neuro: Alert & orientedx3,  cranial nerves grossly intact. moves all 4 extremities w/o difficulty. Affect pleasant   Telemetry   Afib 100-110s. Personally reviewed  EKG    No new tracings.   Labs    CBC Recent Labs    09/04/17 0426 09/05/17 0537  WBC 8.0 6.5  HGB 8.2* 8.0*  HCT 27.1* 27.0*  MCV 90.6 90.6  PLT 200 217   Basic Metabolic Panel Recent Labs    16/12/9604/26/19 0426 09/04/17 1938 09/05/17 0537  NA 140 139 138  K 3.3* 3.0* 2.9*  CL 97* 95* 96*  CO2 33* 36* 36*  GLUCOSE 169* 152* 124*  BUN 57* 56* 54*  CREATININE 1.29* 1.25* 1.15*  CALCIUM 8.3* 8.2* 8.1*  MG 2.2  --  1.9   Liver Function Tests No results for input(s): AST, ALT, ALKPHOS, BILITOT, PROT, ALBUMIN in the last 72 hours. No results for input(s): LIPASE, AMYLASE in the last 72 hours. Cardiac Enzymes No results for input(s): CKTOTAL, CKMB, CKMBINDEX, TROPONINI in the last 72 hours.  BNP: BNP (last 3 results) Recent Labs    08/31/17 1537  BNP 492.0*    ProBNP (last 3 results) No results for input(s): PROBNP in the last 8760 hours.   D-Dimer No results for input(s): DDIMER in the last 72 hours. Hemoglobin A1C No results for input(s): HGBA1C in the last 72 hours. Fasting Lipid Panel No results for  input(s): CHOL, HDL, LDLCALC, TRIG, CHOLHDL, LDLDIRECT in the last 72 hours. Thyroid Function Tests Recent Labs    09/04/17 0426 09/04/17 0845  TSH 6.096*  --   T3FREE  --  1.8*    Other results:   Imaging    No results found.   Medications:     Scheduled Medications: . allopurinol  300 mg Oral Daily  . aspirin EC  81 mg Oral Daily  . chlorhexidine  15 mL Mouth Rinse BID  . Chlorhexidine Gluconate Cloth  6 each Topical Daily  . colchicine  0.6 mg Oral BID  . digoxin  0.125 mg Oral Daily  . famotidine  20 mg Oral QHS  . feeding supplement (ENSURE ENLIVE)  237 mL Oral BID BM  . ipratropium  0.5 mg Nebulization TID  . levalbuterol  0.63 mg Nebulization TID  . mouth rinse  15 mL Mouth Rinse q12n4p  .  potassium chloride  40 mEq Oral BID  . sodium chloride flush  10-40 mL Intracatheter Q12H    Infusions: . amiodarone 30 mg/hr (09/05/17 0600)  . furosemide (LASIX) infusion 20 mg/hr (09/05/17 0600)  . heparin 1,150 Units/hr (09/05/17 0600)  . milrinone 0.125 mcg/kg/min (09/05/17 0600)  . norepinephrine (LEVOPHED) Adult infusion 6 mcg/min (09/05/17 0600)    PRN Medications: acetaminophen **OR** acetaminophen, guaiFENesin-dextromethorphan, levalbuterol, ondansetron **OR** ondansetron (ZOFRAN) IV, oxyCODONE, sodium chloride, sodium chloride flush    Patient Profile   Ms Schabel is an 84 year old with a history of  A fib, chronic respiratory failure on home oxygen, GERD, HTN, dementia, and diastolic heart failure.   Admitted with marked volume overload and A fib RVR  Assessment/Plan   1. Shock--> Acute/Chronic RV Failure - ?High output state - ECHO 09/01/17: EF 60-65%  - Marked volume overload. Good UOP with IV lasix, but she is getting a lot of IVF with drips - Continue lasix drip 20 mg/hr.  - CVP 15-16 - Continue milrinone 0.125 mcg/kg/min and norepinephrine. Midodrine added to wean NE. SBP 100-110s. Coox 84% - Add midodrine 5 mg TID to try to wean norepi - Continue digoxin 0.125 mg daily  - Continue unna boots - She is a DNR/I - Plan to treat aggressively for 2-3 days. If she improves, consider RHC. If she does not improve, would move toward palliative. Possible RHC on Monday  - Unable to concentrate any of her drips - discussed with PharmD  2. A fib RVR with hx of PAF - Rates 110-120s - Continue amio drip 30 mg per hour.  - Continue heparin drip.  - May need TEE/DCCV once diuresed. She is not on AC at home. No change.   3. ?CKD 3 - Improving with diuresis - Creatinine 1.15. Monitor daily BMET  4. Hypokalemia - K 2.9. Increase daily sup to 40 QID  5. Chronic respiratory failure - On home 2 L O2 - Remains on 5 L [place] - Afebrile. No leukocytosis.   6.  Dementia/AMS - Ammonia normal 34 - Remains alert this am.   7. Gout - Uric acid elevated 15.5. Now on colchicine and oxycodone.   8. Hypothyroidism - TSH elevated to 6. T4 normal, T3 slightly low. Discussed with pharmD. Will not treat at this time.   9. Anemia - Hemoglobin 8.0. Received ferraheme yesterday. Denies bleeding.   Discussed with Dr Gala Romney. We would like to remain consulted, not primary, due to multiple health issues including anemia, gout, hypokalemia, and A/C respiratory failure.   Medication concerns reviewed with  patient and pharmacy team. Barriers identified: cognitive  Length of Stay: 5  Alford Highland, NP  09/05/2017, 7:07 AM  Advanced Heart Failure Team Pager [phone] (M-F; 7a - 4p)  Please contact [facility] for night-coverage after hours (4p -7a ) and weekends on [email or web address]  Agree with above.   Remains extremely tenuous. Still on norepinephrine 4 and milrinone @ 0.125 mcg/kg/ min. Co-ox 84%. On lasix gtt - was on 20 but turned down to 15 due to increasing AF rate. Urine output has slowed down. CVP remains 15-16. AF rate 120s on IV amio (AF is chronic) Very weak. K low and being supped  Chronically ill appearing obese woman Alert JVP to jaw  RIJ TLC Cor tachy irregular 2/6 TR Ab obese NT Ext warm 3+ edema. + UNNA boots  She remains very tenuous.Wil continue pressors and IV lasix for now. Add metolazone. May benefit from diamox. She markedly decondition with very poor functional status. Will continue aggressive efforts for now and see where we end up in next 24-48 hours but if doesn;t turn around soon. Hospice care may be best option.  CRITICAL CARE Performed by: Arvilla Meres  Total critical care time: 35 minutes  Critical care time was exclusive of separately billable procedures and treating other patients.  Critical care was necessary to treat or prevent imminent or life-threatening deterioration.  Critical care was time spent personally  by me (independent of midlevel providers or residents) on the following activities: development of treatment plan with patient and/or surrogate as well as nursing, discussions with consultants, evaluation of patient's response to treatment, examination of patient, obtaining history from patient or surrogate, ordering and performing treatments and interventions, ordering and review of laboratory studies, ordering and review of radiographic studies, pulse oximetry and re-evaluation of patient's condition.  Arvilla Meres, MD  7:16 PM

## 2017-09-05 NOTE — Progress Notes (Signed)
eLink Physician-Brief Progress Note Patient Name: Emily [name] DOB: 04/18/1931 MRN: [id number]   Date of Service  09/05/2017  HPI/Events of Note  Foot pain r/o gout. Pt is on allopurinol but not colchicine. She has AKI relatively contraindicating NSAID.  eICU Interventions  Will trial Colchicine + oxycodone. Add NSAID if creatinine normalizes in the next day or so.        Thomasene Lotkoronkwo U Tajuan Dufault 09/05/2017, 12:43 AM

## 2017-09-05 NOTE — Progress Notes (Signed)
PULMONARY / CRITICAL CARE MEDICINE   Name: Emily [name] MRN: [id number] DOB: 05/24/1931    ADMISSION DATE:  08/31/2017 CONSULTATION DATE:  09/03/2017  REFERRING MD:  Dr. Purvis SheffieldKoneswaran  CHIEF COMPLAINT:  Shortness of breath, LE edema  HISTORY OF PRESENT ILLNESS:   Emily [name] is a 84 year old female with a past medical history of Diastolic Heart failure, A. Fib, chronic respiratory failure on home O2, GERD, hypertension.  She was admitted to [facility] on 08/31/17 with complaints of increased leg swelling, weight increase, and weakness.  She is followed by her cardiologist Dr. Purvis SheffieldKoneswaran.  It is noted that she has gained approximately 30 pounds over the past several weeks.  Initial ED evaluation at [facility] revealed BNP of 400, chest x-ray with mild CHF, acute renal failure with creatinine of 1.4 (baseline creatinine 0.5-1 in 2017), and hypotension. While at [facility], she was treated for right-sided heart failure and volume overload that was poorly responsive to IV diuretics and AKI.  Course was complicated by hypotension requiring Milrinone and Neo-Synehprine infusions, as well as A-Fib w/ RVR requiring Amiodarone and Heparin infusions.   She was [facility] to [facility] on 09/03/17 for further evaluation and management by the Heart Failure team.  PCCM is asked to admit patient.   SUBJECTIVE:  Feels better. No distress. Wants to get out of bed.  Have managed to wean off norepinephrine over the course of today.  Pains of left foot pain VITAL SIGNS: BP (!) 94/48   Pulse (!) 131   Temp 98.2 F (36.8 C) (Oral)   Resp 20   Ht 5\' 4"  (1.626 m)   Wt 270 lb 8.1 oz (122.7 kg)   SpO2 92%   BMI 46.43 kg/m  5 liters  HEMODYNAMICS: CVP:  [8 mmHg-21 mmHg] 8 mmHg  VENTILATOR SETTINGS:    INTAKE / OUTPUT: I/O last 3 completed shifts: In: 2807.4 [P.O.:440; I.V.:2252.5; IV Piggyback:114.9] Out: 7212.5 [Urine:7212.5]  PHYSICAL EXAMINATION: General  84 year old white female. Resting  in bed. No acute distress.  HENT NCAT.  JVP elevated to angle of the jaw with CV waves MMM pulm decreased bases no accessory use  Card AF 2/6 holosystolic murmur best heard at the right sternal border abd obese + bowel sounds no OM Ext Marked chronic lower extremity pitting edema particularly in the dependent zone GU clear yellow via FC Neuro awake and alert no focal def. Confused at baseline MSK bilateral Unna boots.  Tenderness to dorsum of left foot and ankle joint line.  LABS:  BMET Recent Labs  Lab 09/04/17 1938 09/05/17 0537 09/05/17 1440  NA 139 138 141  K 3.0* 2.9* 3.6  CL 95* 96* 96*  CO2 36* 36* 34*  BUN 56* 54* 52*  CREATININE 1.25* 1.15* 1.18*  GLUCOSE 152* 124* 147*    Electrolytes Recent Labs  Lab 09/03/17 0418  09/04/17 0426 09/04/17 1938 09/05/17 0537 09/05/17 1440  CALCIUM 8.1*   < > 8.3* 8.2* 8.1* 8.5*  MG 2.2  --  2.2  --  1.9  --    < > = values in this interval not displayed.    CBC Recent Labs  Lab 09/02/17 0506 09/04/17 0426 09/05/17 0537  WBC 6.8 8.0 6.5  HGB 8.4* 8.2* 8.0*  HCT 27.9* 27.1* 27.0*  PLT 189 200 217    Coag's No results for input(s): APTT, INR in the last 168 hours.  Sepsis Markers No results for input(s): LATICACIDVEN, PROCALCITON, O2SATVEN in the last  168 hours.  ABG Recent Labs  Lab 09/02/17 1015 09/02/17 2055  PHART 7.407 7.401  PCO2ART 52.9* 54.4*  PO2ART 63.7* 68.5*    Liver Enzymes Recent Labs  Lab 08/31/17 1530 09/02/17 0506  AST 25 20  ALT 9* 9*  ALKPHOS 75 64  BILITOT 1.1 1.3*  ALBUMIN 2.4* 2.8*    Cardiac Enzymes Recent Labs  Lab 08/31/17 1537  TROPONINI <0.03    Glucose Recent Labs  Lab 09/02/17 1728 09/03/17 1549 09/05/17 0849  GLUCAP 111* 102* 116*    Imaging No results found.   STUDIES:  ECHO 09/01/17>> LV EF 60-65%.  Unable to evaluate LV diastolic function. Ventricular septum with diastolic flattening consistent with RV overload. Right atrium and right ventricle  mildly dilated.  PA peak pressure 34 mm Hg.  Moderate-severe Mitral valve regurgitation.  CULTURES:  ANTIBIOTICS:   SIGNIFICANT EVENTS: 6/22 Admission to [facility] 6/25>> Transfer to [facility]   LINES/TUBES: 6/24>> R arm PICC>>  DISCUSSION: 84 year old female with severe volume overload and suspicion for Acute on Chronic Right-sided heart failure, hypotension, AKI, and A-fib w/RVR requiring Lasix gtt, Milrinone gtt, Amiodarone gtt, &Neo-synephrine gtt.  ASSESSMENT / PLAN:  Acute Hypoxic Respiratory Failure in setting of Acute on Chronic CHF Pcxr: persistent pulmonary edema. No sig change  Plan Wean oxygen Cont Diuresis   Acute on Chronic Right-sided Heart Failure -ECHO 6/22>>LV EF 60-65%, ventricular septum with diastolic flattening consistent with RV overload. Right atrium and right ventricle mildly dilated.  PA peak pressure 34 mm Hg Cardiogenic shock in setting of CHF A-fib w/RVR Plan Cont inotrope and vasoactive gtts per cards Cont IV lasix and metolazone  amio per cards Cont heparin gtt.  Cont asa   AKI-->improving Plan Cont to maximize CO Cont Diuresis (see above) Renal dose meds Strict intake and output   Fluid and electrolyte imbalance ->hypokalemia  Plan Replace and recheck  Left foot pain with elevated uric acid consistent with gout. Course of colchicine Start on allopurinol.  Anemia hgb stable/ no evidence of bleeding. Normocytic/normochromic  Plan  H/o GERD Plan Cont PPI  Hx: Dementia Plan Supportive care    FAMILY  - Updates: Daughter updated at bedside 6/27.  - Inter-disciplinary family meet or Palliative Care meeting due by:  09/10/2017   CRITICAL CARE Performed by: Lynnell Catalan   Total critical care time: 45 minutes  Critical care time was exclusive of separately billable procedures and treating other patients.  Critical care was necessary to treat or prevent imminent or life-threatening deterioration.  Critical care was  time spent personally by me on the following activities: development of treatment plan with patient and/or surrogate as well as nursing, discussions with consultants, evaluation of patient's response to treatment, examination of patient, obtaining history from patient or surrogate, ordering and performing treatments and interventions, ordering and review of laboratory studies, ordering and review of radiographic studies, pulse oximetry and re-evaluation of patient's condition.   Lynnell Catalan, MD [facility] ICU Physician [facility]  Pager: [phone] Mobile: [phone] After hours: [phone].   09/05/2017, 4:51 PM

## 2017-09-05 NOTE — Progress Notes (Signed)
  ANTICOAGULATION CONSULT NOTE  Pharmacy Consult for  heparin dosing Indication: atrial fibrillation  Allergies  Allergen Reactions  . Sulfa Antibiotics Rash  . Quinine Derivatives Other (See Comments)    Bleeding from mouth. Affteced bone marrow. Decreased WBC.   Patient Measurements: Height: 5\' 4"  (162.6 cm) Weight: 270 lb 8.1 oz (122.7 kg) IBW/kg (Calculated) : 54.7 HEPARIN DW (KG): 83.9  Vital Signs: Temp: 98 F (36.7 C) (06/27 1134) Temp Source: Oral (06/27 1134) BP: 108/58 (06/27 1200) Pulse Rate: 114 (06/27 1200)  Labs: Recent Labs    09/03/17 0418  09/04/17 0426 09/04/17 1938 09/05/17 0537  HGB  --   --  8.2*  --  8.0*  HCT  --   --  27.1*  --  27.0*  PLT  --   --  200  --  217  HEPARINUNFRC 0.59  --  0.43  --  0.32  CREATININE 1.36*   < > 1.29* 1.25* 1.15*   < > = values in this interval not displayed.   Estimated Creatinine Clearance: 47.1 mL/min (A) (by C-G formula based on SCr of 1.15 mg/dL (H)).  Assessment: [age] yof with atrial fibrillation admitted with leg swelling and chronic diastolic/RV failure CHF, started on milrinone and [facility].   Heparin level came back therapeutic at 0.32, on 1150 units/hr. Hgb 8.0, plt 217. No s/sx of bleeding- small nose bleed earlier and nursing did notice slight bleeding when brushing teeth. No reported infusion issues.   Goal of Therapy:   Heparin level 0.3-0.7 IU/mL   Plan:  Continue heparin infusion at 1150 units/hr Check anti-Xa level daily while on heparin Continue to monitor H&H and platelets   Reece LeaderJessica Jada Fass, Colon Flatteryharm D, BCPS, [facility]BCCP Clinical Pharmacist Phone [phone]  09/05/2017 1:20 PM

## 2017-09-05 NOTE — Evaluation (Signed)
Occupational Therapy Evaluation Patient Details Name: Emily [name] MRN: [id number] DOB: 02/11/1932 Today's Date: 09/05/2017    History of Present Illness Pt is an 84 y.o. female admitted 08/31/17 with c/o BLE swelling, weight gain and weakness. CXR with mild CHF with persistent pulmonary edema; pt also with AKI, hypotension and a-fib with RVR. [facility] by HF team. PMH includes a-fib, CHF, dementia.   Clinical Impression   Pt walked with a RW short distances, self fed and was otherwise dependent in ADL and IADL prior to admission. She has a very supportive daughter who participated in session and is her primary caregiver at home. Pt currently requires 2 person assist for bed level mobility and was unable to stand this visit. Pt was transferred to the chair using the [facility]. She requires total assist for ADL. Pt will likely need extensive rehab upon discharge. Recommending SNF. Will follow acutely.    Follow Up Recommendations  SNF;Supervision/Assistance - 24 hour    Equipment Recommendations  Wheelchair (measurements OT);Wheelchair cushion (measurements OT)    Recommendations for Other Services       Precautions / Restrictions Precautions Precautions: Fall Precaution Comments: Multiple lines Restrictions Weight Bearing Restrictions: No      Mobility Bed Mobility Overal bed mobility: Needs Assistance Bed Mobility: Supine to Sit     Supine to sit: Max assist;+2 for physical assistance;+2 for safety/equipment        Transfers Overall transfer level: Needs assistance Equipment used: 2 person hand held assist Transfers: Sit to/from Stand Sit to Stand: Total assist         General transfer comment: Unable to stand fully upright despite maxA+2-totalA. TotalA for transfer to recliner with use of maxisky    Balance Overall balance assessment: Needs assistance Sitting-balance support: Bilateral upper extremity supported;Feet unsupported;Feet  supported Sitting balance-Leahy Scale: Fair Sitting balance - Comments: Intermittent min guard for sitting balance, but reliance on BUE support. Easily fatigued requing min-modA     Standing balance-Leahy Scale: Zero                             ADL either performed or assessed with clinical judgement   ADL                                         General ADL Comments: pt requires total assist     Vision Patient Visual Report: No change from baseline       Perception     Praxis      Pertinent Vitals/Pain Pain Assessment: Faces Faces Pain Scale: Hurts whole lot Pain Location: Bilat feet/ankles Pain Descriptors / Indicators: Grimacing;Crying Pain Intervention(s): Monitored during session;Repositioned;RN gave pain meds during session     Hand Dominance Right   Extremity/Trunk Assessment Upper Extremity Assessment Upper Extremity Assessment: Generalized weakness   Lower Extremity Assessment Lower Extremity Assessment: Defer to PT evaluation RLE Deficits / Details: Pt grimacing with all BLE movement, significant lower leg edema, functional strength <3/5  RLE: Unable to fully assess due to pain RLE Coordination: decreased fine motor;decreased gross motor LLE Deficits / Details: Pt grimacing with all BLE movement, significant lower leg edema, functional strength <3/5  LLE: Unable to fully assess due to pain LLE Coordination: decreased fine motor;decreased gross motor   Cervical / Trunk Assessment Cervical / Trunk Assessment: Other exceptions(obesity)  Communication Communication Communication: No difficulties   Cognition Arousal/Alertness: Awake/alert Behavior During Therapy: WFL for tasks assessed/performed;Anxious Overall Cognitive Status: History of cognitive impairments - at baseline Area of Impairment: Attention;Following commands;Awareness;Orientation;Problem solving                 Orientation Level: Disoriented  to;Time;Situation Current Attention Level: Sustained   Following Commands: Follows one step commands inconsistently   Awareness: Intellectual Problem Solving: Decreased initiation;Requires verbal cues General Comments: Daughter reports baseline not always oriented to date, but takes medication to assist memory. Internally distracted by fear of falling and itching; "what are we doing now, what are we doing next". Sitting on hand at end of session with poor awareness of this and how to correct   General Comments  Daughter (caregiver) present throughout session    Exercises     Shoulder Instructions      Home Living Family/patient expects to be discharged to:: Private residence Living Arrangements: Children Available Help at Discharge: Family;Available 24 hours/day Type of Home: House Home Access: Stairs to enter [employer] of Steps: 4(has access to a ramp)   Home Layout: One level     Bathroom Shower/Tub: Other (comment)(sponge bathes)         Home Equipment: Walker - 2 wheels;Cane - single point;Hospital bed;Bedside commode   Additional Comments: 2L home O2 as needed; lift chair. Daughter is full-time caregiver      Prior Functioning/Environment Level of Independence: Needs assistance  Gait / Transfers Assistance Needed: Household ambulation with RW; daughter reports difficulty getting up/down stairs ADL's / Homemaking Assistance Needed: Daughter assists with ADLs, including dressing, sponge bathing, household tasks            OT Problem List: Decreased strength;Decreased activity tolerance;Impaired balance (sitting and/or standing);Decreased coordination;Decreased cognition;Decreased knowledge of use of DME or AE;Cardiopulmonary status limiting activity;Obesity;Impaired UE functional use;Pain      OT Treatment/Interventions: Self-care/ADL training;Therapeutic exercise;DME and/or AE instruction;Cognitive remediation/compensation;Patient/family  education;Balance training    OT Goals(Current goals can be found in the care plan section) Acute Rehab OT Goals Patient Stated Goal: Get out of bed OT Goal Formulation: With family Time For Goal Achievement: 09/19/17 Potential to Achieve Goals: Fair  OT Frequency: Min 2X/week   Barriers to D/C:            Co-evaluation PT/OT/SLP Co-Evaluation/Treatment: Yes Reason for Co-Treatment: Complexity of the patient's impairments (multi-system involvement)   OT goals addressed during session: ADL's and self-care      AM-PAC PT "6 Clicks" Daily Activity     Outcome Measure Help from another person eating meals?: Total Help from another person taking care of personal grooming?: Total Help from another person toileting, which includes using toliet, bedpan, or urinal?: Total Help from another person bathing (including washing, rinsing, drying)?: Total Help from another person to put on and taking off regular upper body clothing?: Total Help from another person to put on and taking off regular lower body clothing?: Total 6 Click Score: 6   End of Session Equipment Utilized During Treatment: Gait belt;Oxygen(7L) Nurse Communication: Need for lift equipment  Activity Tolerance: Patient limited by fatigue;Patient limited by pain Patient left: in chair;with call bell/phone within reach;with family/visitor present;with nursing/sitter in room  OT Visit Diagnosis: Unsteadiness on feet (R26.81);Muscle weakness (generalized) (M62.81);Pain;Other symptoms and signs involving cognitive function                Time: 1610-9604 OT Time Calculation (min): 44 min Charges:  OT General Charges $OT Visit: 1  Visit OT Evaluation $OT Eval Moderate Complexity: 1 Mod G-Codes:     {Emily [name], Emily [name] 09/05/2017, 1:24 PM  09/05/2017 Martie Round, OTR/L Pager: [phone]

## 2017-09-05 NOTE — Progress Notes (Signed)
[facility]  Patient Status: Active (receiving services up to time of hospitalization)  [facility] is providing the following services: RN, PT and OT  If patient discharges after hours, please call [phone].   Emily FurnishDonna [name] 09/05/2017, 12:05 PM

## 2017-09-05 NOTE — Evaluation (Signed)
Physical Therapy Evaluation Patient Details Name: Emily EagleDoris J Kinch MRN: [id number] DOB: 07/08/1931 Today's Date: 09/05/2017   History of Present Illness  Pt is an 84 y.o. female admitted 08/31/17 with c/o BLE swelling, weight gain and weakness. CXR with mild CHF with persistent pulmonary edema; pt also with AKI, hypotension and a-fib with RVR. [facility] by HF team. PMH includes a-fib, CHF, dementia.    Clinical Impression  Pt presents with an overall decrease in functional mobility secondary to above. PTA, pt lives with daughter who provides 24/7 support; pt was ambulating with RW, daughter assisted with sponge bathing, dressing, and household tasks. Today, pt required maxA+2 for bed mobility; unable to stand despite maxA+2, limited by c/o bilat foot pain. Transferred to recliner with maxisky lift. Daughter present throughout session and very supportive. Pt would benefit from continued acute PT services to maximize functional mobility and independence prior to d/c with SNF-level therapies.     Follow Up Recommendations SNF;Supervision/Assistance - 24 hour    Equipment Recommendations  Wheelchair (measurements PT);Wheelchair cushion (measurements PT)    Recommendations for Other Services       Precautions / Restrictions Precautions Precautions: Fall Precaution Comments: Multiple lines Restrictions Weight Bearing Restrictions: No      Mobility  Bed Mobility Overal bed mobility: Needs Assistance Bed Mobility: Supine to Sit     Supine to sit: Max assist;+2 for physical assistance;+2 for safety/equipment        Transfers Overall transfer level: Needs assistance Equipment used: 2 person hand held assist Transfers: Sit to/from Stand Sit to Stand: Total assist         General transfer comment: Unable to stand fully upright despite maxA+2-totalA. TotalA for transfer to recliner with use of maxisky  Ambulation/Gait             General Gait  Details: NT  Stairs            Wheelchair Mobility    Modified Rankin (Stroke Patients Only)       Balance Overall balance assessment: Needs assistance Sitting-balance support: Bilateral upper extremity supported;Feet unsupported;Feet supported Sitting balance-Leahy Scale: Fair Sitting balance - Comments: Intermittent min guard for sitting balance, but reliance on BUE support. Easily fatigued requing min-modA     Standing balance-Leahy Scale: Zero                               Pertinent Vitals/Pain Pain Assessment: Faces Faces Pain Scale: Hurts whole lot Pain Location: Bilat feet/ankles Pain Descriptors / Indicators: Grimacing;Crying Pain Intervention(s): Limited activity within patient's tolerance;Monitored during session;RN gave pain meds during session    Home Living Family/patient expects to be discharged to:: Private residence Living Arrangements: Children Available Help at Discharge: Family;Available 24 hours/day(Daughter) Type of Home: House Home Access: Stairs to enter   [employer]trance Stairs-Number of Steps: 4(reports access to rail) Home Layout: One level Home Equipment: Environmental consultantWalker - 2 wheels;Cane - single point;Hospital bed;Bedside commode Additional Comments: 2L home O2 as needed; lift chair. Daughter is full-time caregiver    Prior Function Level of Independence: Needs assistance   Gait / Transfers Assistance Needed: Household ambulation with RW; daughter reports difficulty getting up/down stairs  ADL's / Homemaking Assistance Needed: Daughter assists with ADLs, including dressing, sponge bathing, household tasks        Hand Dominance        Extremity/Trunk Assessment   Upper Extremity Assessment Upper Extremity Assessment: Generalized weakness  Lower Extremity Assessment Lower Extremity Assessment: Generalized weakness;RLE deficits/detail;LLE deficits/detail RLE Deficits / Details: Pt grimacing with all BLE movement, significant  lower leg edema, functional strength <3/5  RLE: Unable to fully assess due to pain RLE Coordination: decreased fine motor;decreased gross motor LLE Deficits / Details: Pt grimacing with all BLE movement, significant lower leg edema, functional strength <3/5  LLE: Unable to fully assess due to pain LLE Coordination: decreased fine motor;decreased gross motor       Communication   Communication: No difficulties  Cognition Arousal/Alertness: Awake/alert Behavior During Therapy: WFL for tasks assessed/performed;Anxious Overall Cognitive Status: History of cognitive impairments - at baseline Area of Impairment: Attention;Following commands;Awareness;Orientation;Problem solving                 Orientation Level: Disoriented to;Time;Situation Current Attention Level: Sustained   Following Commands: Follows one step commands inconsistently   Awareness: Intellectual Problem Solving: Decreased initiation;Requires verbal cues General Comments: Daughter reports baseline not always oriented to date, but takes medication to assist memory. Internally distracted by fear of falling and itching; "what are we doing now, what are we doing next". Sitting on hand at end of session with poor awareness of this and how to correct      General Comments General comments (skin integrity, edema, etc.): Daughter (caregiver) present throughout session    Exercises     Assessment/Plan    PT Assessment Patient needs continued PT services  PT Problem List Decreased strength;Decreased activity tolerance;Decreased balance;Decreased mobility;Decreased cognition;Decreased knowledge of use of DME;Cardiopulmonary status limiting activity;Pain       PT Treatment Interventions DME instruction;Gait training;Stair training;Functional mobility training;Therapeutic activities;Therapeutic exercise;Balance training;Patient/family education;Wheelchair mobility training    PT Goals (Current goals can be found in the  Care Plan section)  Acute Rehab PT Goals Patient Stated Goal: Get out of bed PT Goal Formulation: With patient/family Time For Goal Achievement: 09/19/17 Potential to Achieve Goals: Good    Frequency Min 2X/week   Barriers to discharge        Co-evaluation PT/OT/SLP Co-Evaluation/Treatment: Yes Reason for Co-Treatment: Necessary to address cognition/behavior during functional activity;For patient/therapist safety;To address functional/ADL transfers PT goals addressed during session: Mobility/safety with mobility         AM-PAC PT "6 Clicks" Daily Activity  Outcome Measure Difficulty turning over in bed (including adjusting bedclothes, sheets and blankets)?: Unable Difficulty moving from lying on back to sitting on the side of the bed? : Unable Difficulty sitting down on and standing up from a chair with arms (e.g., wheelchair, bedside commode, etc,.)?: Unable Help needed moving to and from a bed to chair (including a wheelchair)?: Total Help needed walking in hospital room?: Total Help needed climbing 3-5 steps with a railing? : Total 6 Click Score: 6    End of Session Equipment Utilized During Treatment: Gait belt;Oxygen Activity Tolerance: Patient tolerated treatment well;Patient limited by fatigue Patient left: in chair;with call bell/phone within reach;with family/visitor present;with nursing/sitter in room Nurse Communication: Mobility status;Need for lift equipment PT Visit Diagnosis: Other abnormalities of gait and mobility (R26.89);Muscle weakness (generalized) (M62.81)    Time: 1610-9604 PT Time Calculation (min) (ACUTE ONLY): 43 min   Charges:   PT Evaluation $PT Eval Moderate Complexity: 1 Mod PT Treatments $Therapeutic Activity: 8-22 mins   PT G Codes:       Ina Homes, PT, DPT Acute Rehab Services  Pager: [phone]  Malachy Chamber 09/05/2017, 10:31 AM

## 2017-09-06 ENCOUNTER — Encounter (HOSPITAL_COMMUNITY): Payer: Self-pay | Admitting: Pulmonary Disease

## 2017-09-06 DIAGNOSIS — F0390 Unspecified dementia without behavioral disturbance: Secondary | ICD-10-CM | POA: Diagnosis present

## 2017-09-06 LAB — BLOOD GAS, ARTERIAL
Acid-Base Excess: 13 mmol/L — ABNORMAL HIGH (ref 0.0–2.0)
Bicarbonate: 38.4 mmol/L — ABNORMAL HIGH (ref 20.0–28.0)
Drawn by: 535271
O2 Content: 6 L/min
O2 Saturation: 94.9 %
Patient temperature: 98.6
pCO2 arterial: 62.3 mmHg — ABNORMAL HIGH (ref 32.0–48.0)
pH, Arterial: 7.406 (ref 7.350–7.450)
pO2, Arterial: 70.3 mmHg — ABNORMAL LOW (ref 83.0–108.0)

## 2017-09-06 LAB — CBC
HEMATOCRIT: 27 % — AB (ref 36.0–46.0)
HEMOGLOBIN: 8 g/dL — AB (ref 12.0–15.0)
MCH: 27.4 pg (ref 26.0–34.0)
MCHC: 29.6 g/dL — AB (ref 30.0–36.0)
MCV: 92.5 fL (ref 78.0–100.0)
Platelets: 183 10*3/uL (ref 150–400)
RBC: 2.92 MIL/uL — AB (ref 3.87–5.11)
RDW: 14.6 % (ref 11.5–15.5)
WBC: 5.1 10*3/uL (ref 4.0–10.5)

## 2017-09-06 LAB — BASIC METABOLIC PANEL
Anion gap: 9 (ref 5–15)
BUN: 51 mg/dL — AB (ref 8–23)
CHLORIDE: 95 mmol/L — AB (ref 98–111)
CO2: 37 mmol/L — AB (ref 22–32)
Calcium: 8.6 mg/dL — ABNORMAL LOW (ref 8.9–10.3)
Creatinine, Ser: 1.2 mg/dL — ABNORMAL HIGH (ref 0.44–1.00)
GFR calc non Af Amer: 40 mL/min — ABNORMAL LOW (ref >60)
GFR, EST AFRICAN AMERICAN: 47 mL/min — AB (ref >60)
Glucose, Bld: 105 mg/dL — ABNORMAL HIGH (ref 70–99)
POTASSIUM: 3.5 mmol/L (ref 3.5–5.1)
Sodium: 141 mmol/L (ref 135–145)

## 2017-09-06 LAB — COOXEMETRY PANEL
Carboxyhemoglobin: 2.2 % — ABNORMAL HIGH (ref 0.5–1.5)
Methemoglobin: 1.3 % (ref 0.0–1.5)
O2 Saturation: 81.2 %
TOTAL HEMOGLOBIN: 8.3 g/dL — AB (ref 12.0–16.0)

## 2017-09-06 LAB — MAGNESIUM: MAGNESIUM: 2.1 mg/dL (ref 1.7–2.4)

## 2017-09-06 LAB — HEPARIN LEVEL (UNFRACTIONATED): Heparin Unfractionated: 0.33 IU/mL (ref 0.30–0.70)

## 2017-09-06 MED ORDER — TORSEMIDE 20 MG PO TABS
40.0000 mg | ORAL_TABLET | Freq: Two times a day (BID) | ORAL | Status: DC
  Administered 2017-09-06 – 2017-09-12 (×12): 40 mg via ORAL
  Filled 2017-09-06 (×12): qty 2

## 2017-09-06 MED ORDER — AMIODARONE HCL 200 MG PO TABS
200.0000 mg | ORAL_TABLET | Freq: Every day | ORAL | Status: DC
  Administered 2017-09-06 – 2017-09-12 (×7): 200 mg via ORAL
  Filled 2017-09-06 (×7): qty 1

## 2017-09-06 MED ORDER — METOPROLOL TARTRATE 12.5 MG HALF TABLET
12.5000 mg | ORAL_TABLET | Freq: Two times a day (BID) | ORAL | Status: DC
  Administered 2017-09-06 – 2017-09-12 (×8): 12.5 mg via ORAL
  Filled 2017-09-06 (×12): qty 1

## 2017-09-06 MED ORDER — POTASSIUM CHLORIDE CRYS ER 20 MEQ PO TBCR
40.0000 meq | EXTENDED_RELEASE_TABLET | Freq: Once | ORAL | Status: AC
  Administered 2017-09-06: 40 meq via ORAL
  Filled 2017-09-06: qty 2

## 2017-09-06 MED ORDER — OXYCODONE HCL 5 MG PO TABS
2.5000 mg | ORAL_TABLET | Freq: Four times a day (QID) | ORAL | Status: DC | PRN
  Administered 2017-09-06 – 2017-09-09 (×7): 2.5 mg via ORAL
  Filled 2017-09-06 (×7): qty 1

## 2017-09-06 NOTE — Progress Notes (Signed)
Follow up - Critical Care Medicine Note  Patient Details:    Emily [name] is an 84 y.o. female.  Longstanding history of predominantly right-sided heart failure with several presentations with generalized edema.  Increasing frequency of hospitalizations recently with increasing apparently diuretic resistant edema.   [facility] with edema, dyspnea and hypotension - similar to prior admissions. Eventually [facility] for AHF Service evaluation.  General feeling, which has been communicated to family is that therapeutic options are limited and that hospice may be best option.  Past Medical History:  Diagnosis Date  . A-fib (HCC)   . CHF (congestive heart failure) (HCC)   . Dementia   . GERD (gastroesophageal reflux disease)   . HTN (hypertension)   . Hypertension      Lines, Airways, Drains: PICC Triple Lumen 09/02/17 PICC Right Brachial 40 cm 1 cm (Active)  Indication for Insertion or Continuance of Line Vasoactive infusions 09/06/2017  8:00 AM  Exposed Catheter (cm) 1 cm 09/02/2017  6:00 PM  Site Assessment Clean;Dry;Intact 09/06/2017  8:00 AM  Lumen #1 Status Infusing 09/06/2017  8:00 AM  Lumen #2 Status Infusing 09/06/2017  8:00 AM  Lumen #3 Status Flushed;In-line blood sampling system in place 09/06/2017  8:00 AM  Dressing Type Transparent;Occlusive 09/06/2017  8:00 AM  Dressing Status Clean;Dry;Intact 09/06/2017  8:00 AM  Line Care Connections checked and tightened;Zeroed and calibrated;Transducer changed;Leveled 09/06/2017  8:00 AM  Dressing Change Due 09/09/17 09/06/2017  8:00 AM     Urethral Catheter Demetrio LappingLawrence Hylton RN Non-latex 14 Fr. (Active)  Indication for Insertion or Continuance of Catheter Aggressive IV diuresis 09/06/2017  8:00 AM  Site Assessment Clean;Intact;Dry;Edema 09/06/2017  8:00 AM  Catheter Maintenance Bag below level of bladder;Catheter secured;Drainage bag/tubing not touching floor;Insertion date on drainage bag;No dependent loops;Seal  intact;Bag emptied prior to transport 09/06/2017  8:00 AM  Collection Container Standard drainage bag 09/06/2017  8:00 AM  Securement Method Securing device (Describe) 09/06/2017  8:00 AM  Urinary Catheter Interventions Unclamped 09/05/2017  8:00 PM  Input (mL) 0 mL 09/03/2017  4:00 PM  Output (mL) 300 mL 09/06/2017 12:00 PM    Anti-infectives:  Anti-infectives (From admission, onward)   None      Microbiology: Results for orders placed or performed during the hospital encounter of 08/31/17  MRSA PCR Screening     Status: None   Collection Time: 08/31/17  9:24 PM  Result Value Ref Range Status   MRSA by PCR NEGATIVE NEGATIVE Final    Comment:        The GeneXpert MRSA Assay (FDA approved for NASAL specimens only), is one component of a comprehensive MRSA colonization surveillance program. It is not intended to diagnose MRSA infection nor to guide or monitor treatment for MRSA infections. Performed at [facility], [address] [place], [place] [address]    BMET    Component Value Date/Time   NA 141 09/06/2017 0443   K 3.5 09/06/2017 0443   CL 95 (L) 09/06/2017 0443   CO2 37 (H) 09/06/2017 0443   GLUCOSE 105 (H) 09/06/2017 0443   BUN 51 (H) 09/06/2017 0443   CREATININE 1.20 (H) 09/06/2017 0443   CALCIUM 8.6 (L) 09/06/2017 0443   GFRNONAA 40 (L) 09/06/2017 0443   GFRAA 47 (L) 09/06/2017 0443   CBC    Component Value Date/Time   WBC 5.1 09/06/2017 0443   RBC 2.92 (L) 09/06/2017 0443   HGB 8.0 (L) 09/06/2017 0443   HCT 27.0 (L) 09/06/2017  0443   PLT 183 09/06/2017 0443   MCV 92.5 09/06/2017 0443   MCH 27.4 09/06/2017 0443   MCHC 29.6 (L) 09/06/2017 0443   RDW 14.6 09/06/2017 0443   LYMPHSABS 0.6 (L) 08/31/2017 1530   MONOABS 0.7 08/31/2017 1530   EOSABS 0.5 08/31/2017 1530   BASOSABS 0.0 08/31/2017 1530    Best Practice/Protocols:  VTE Prophylaxis: Heparin (drip)  Events:   Admitted on 6/26 and started on milrinone and furosemide infusion. Amiodarone  added for AF rate control.    Started on treatment for gout flare L foot 6/27.  Studies: Dg Chest Port 1 View  Result Date: 09/04/2017 CLINICAL DATA:  Acute respiratory failure EXAM: PORTABLE CHEST 1 VIEW COMPARISON:  09/02/2017 FINDINGS: Cardiac shadow remains enlarged. Right-sided PICC line is noted in satisfactory position. Mild bibasilar atelectatic changes are noted. Mild vascular congestion is again seen. IMPRESSION: Mild vascular congestion and bibasilar atelectasis. Electronically Signed   By: Alcide Clever M.D.   On: 09/04/2017 08:07   Consults: Treatment Team:  Antoine Poche, MD   Subjective:    Overnight Issues:  Increasing hea  Objective:  Vital signs for last 24 hours: Temp:  [97.9 F (36.6 C)-98.7 F (37.1 C)] 98.7 F (37.1 C) (06/28 0740) Pulse Rate:  [114-140] 124 (06/28 1200) Resp:  [12-24] 21 (06/28 1200) BP: (74-113)/(37-96) 108/69 (06/28 1200) SpO2:  [88 %-97 %] 93 % (06/28 1200) FiO2 (%):  [92 %] 92 % (06/27 2034) Weight:  [269 lb 2.9 oz (122.1 kg)] 269 lb 2.9 oz (122.1 kg) (06/28 0400)  Hemodynamic parameters for last 24 hours: CVP:  [0 mmHg-24 mmHg] 24 mmHg  Intake/Output from previous day: 06/27 0701 - 06/28 0700 In: 1996.9 [P.O.:50; I.V.:1946.9] Out: 2357.5 [Urine:2357.5]  Intake/Output this shift: Total I/O In: 213.2 [I.V.:213.2] Out: 525 [Urine:525]  Vent settings for last 24 hours: FiO2 (%):  [92 %] 92 %  Physical Exam:  General: no respiratory distress and appear frail and fatigued. Neuro: alert, oriented, nonfocal exam and generalized weakness. HEENT/Neck: JVD severe and CV waves consistent with tricuspid regurgitation. Resp: bronchophony bibasilar and diminished breath sounds bibasilar CVS: prominent apical impulse. S1, S2 unremarkable. 2/6 HSM best heard at RSB. Extremities warm. GI: Protuberant abdomen but soft  Skin: Unna boots on both feet. Frail skin with ecchymoses. Extremities: marked edema of lower extremities in  dependent areas.  Assessment/Plan:   NEURO  Dementia but generally cooperative.  More somnolent today. No clear history of OSA, but HF may contribute to sleep disordered breathing.  Context sensitive half life of analgesics likely prolonged.   Plan: ABG to rule out hypoventilation which might contribute to somnolence.  We will also decrease dose of pain medications.  PULM  Hypoxia secondary to combination of mild pulmonary edema and atelectasis from prolonged recumbency.   Plan: Progressive ambulation.  Incentive spirometry.  CARDIO  End-stage decompensated right heart failure and severe tricuspid regurgitation with anasarca.  Limited long-term therapeutic options.  Blood pressure has improved, norepinephrine discontinued.  Milrinone discontinued yesterday due to increased heart rate.  Blood pressure has remained stable. Atrial fibrillation with rapid ventricular response-on amiodarone infusion.   Plan: Convert amiodarone to oral and add low-dose beta-blocker for rate control.  Continue digoxin.  Limited improvement with advanced therapeutics.  We will progressively convert back to home medication regimen.  RENAL  Acute on chronic renal insufficiency.  Creatinine level likely under represents degree of renal dysfunction given poor muscle mass.  Mild increase in creatinine and rising CO2 suggests  intravascular volume contraction.  Metabolic alkalosis may also be compensatory for undiagnosed hypoventilation. Her edema is asymmetric and predominantly involves the lower extremities, thus some of her edema may in fact be due to to her obesity and poor venous return rather than heart failure.   Plan: Decrease furosemide infusion.  Continue metolazone.  Add acetazolamide.  Plan to convert over to torsemide tomorrow.  GI  Nutritional Deficiency High nutritional risk.  Limited appetite.   Plan: Continue to encourage enteral nutrition.  ID  Presently no signs of intercurrent infection.   Plan: No  antibiotic therapy for now  HEME  Anemia anemia of chronic disease) Presently anticoagulated for atrial fibrillation   Plan: Continue IV heparin for now.  Consider converting to oral anticoagulation soon.  ENDO  euglycemic on no insulin therapy with no history of diabetes.   Plan: Continue to monitor.  Global Issues   Advanced right-sided heart failure with tricuspid regurgitation.  Limited therapeutic interventions especially in light of age and comorbidities.  While there appears to have been some stabilization of her condition, overall progress towards discharge in independent living has been limited. Thus will begin the process of converting medications back over to oral therapy in view of a more comfort oriented approach in the next few days.   LOS: 6 days   Additional comments: Patient is DNR.  Daughter has been updated at bedside.  Critical Care Total Time*: 1 Hour  Jaylan Duggar 09/06/2017  *Care during the described time interval was provided by me and/or other providers on the critical care team.  I have reviewed this patient's available data, including medical history, events of note, physical examination and test results as part of my evaluation.

## 2017-09-06 NOTE — Progress Notes (Addendum)
Advanced Heart Failure Rounding Note  PCP-Cardiologist: Prentice Docker, MD   Subjective:    Sluggish UOP with lasix drip @ 15 mg/hr + metolazone 5 mg. -360 mls. Weight down 1 lb. Creatinine stable 1.2. K 3.5. CVP 23  Off norepinephrine. Continue milrinone @ 0.125 mcg/kg/ min. Co-ox 81%.  Remains in Afib 110-130 on amio @ 30 and heparin drip.   Hemoglobin 8.0. Received ferraheme 6/26    Drowsy this am. Not very interactive. SOB unchanged.  Objective:   Weight Range: 269 lb 2.9 oz (122.1 kg) Body mass index is 46.2 kg/m.   Vital Signs:   Temp:  [97.9 F (36.6 C)-98.7 F (37.1 C)] 98.7 F (37.1 C) (06/28 0740) Pulse Rate:  [114-140] 123 (06/28 0912) Resp:  [12-24] 24 (06/28 0900) BP: (84-113)/(37-96) 113/96 (06/28 0900) SpO2:  [85 %-97 %] 96 % (06/28 0900) FiO2 (%):  [92 %] 92 % (06/27 2034) Weight:  [269 lb 2.9 oz (122.1 kg)] 269 lb 2.9 oz (122.1 kg) (06/28 0400) Last BM Date: 09/01/17  Weight change: Filed Weights   09/04/17 0423 09/05/17 0500 09/06/17 0400  Weight: 271 lb 6.2 oz (123.1 kg) 270 lb 8.1 oz (122.7 kg) 269 lb 2.9 oz (122.1 kg)    Intake/Output:   Intake/Output Summary (Last 24 hours) at 09/06/2017 0947 Last data filed at 09/06/2017 0900 Gross per 24 hour  Intake 2023.81 ml  Output 2070 ml  Net -46.19 ml      Physical Exam    CVP 23 General: No resp difficulty. HEENT: Normal Neck: Supple. JVP to jaw. Carotids 2+ bilat; no bruits. No thyromegaly or nodule noted. Cor: PMI nondisplaced. IRR, 2/6 TR, soft MR + RV lift Lungs: CTAB, normal effort. Abdomen: Soft, non-tender, non-distended, no HSM. No bruits or masses. +BS  Extremities: No cyanosis, clubbing, or rash. R and LLE 3+ edema into thighs. RUE PICC, CDI Neuro: Alert & orientedx3, cranial nerves grossly intact. moves all 4 extremities w/o difficulty. Affect pleasant  Telemetry   Afib 110-130. Personally reviewed.   EKG    No new tracings.   Labs    CBC Recent Labs   09/05/17 0537 09/06/17 0443  WBC 6.5 5.1  HGB 8.0* 8.0*  HCT 27.0* 27.0*  MCV 90.6 92.5  PLT 217 183   Basic Metabolic Panel Recent Labs    96/04/54 0537 09/05/17 1440 09/06/17 0443  NA 138 141 141  K 2.9* 3.6 3.5  CL 96* 96* 95*  CO2 36* 34* 37*  GLUCOSE 124* 147* 105*  BUN 54* 52* 51*  CREATININE 1.15* 1.18* 1.20*  CALCIUM 8.1* 8.5* 8.6*  MG 1.9  --  2.1   Liver Function Tests No results for input(s): AST, ALT, ALKPHOS, BILITOT, PROT, ALBUMIN in the last 72 hours. No results for input(s): LIPASE, AMYLASE in the last 72 hours. Cardiac Enzymes No results for input(s): CKTOTAL, CKMB, CKMBINDEX, TROPONINI in the last 72 hours.  BNP: BNP (last 3 results) Recent Labs    08/31/17 1537  BNP 492.0*    ProBNP (last 3 results) No results for input(s): PROBNP in the last 8760 hours.   D-Dimer No results for input(s): DDIMER in the last 72 hours. Hemoglobin A1C No results for input(s): HGBA1C in the last 72 hours. Fasting Lipid Panel No results for input(s): CHOL, HDL, LDLCALC, TRIG, CHOLHDL, LDLDIRECT in the last 72 hours. Thyroid Function Tests Recent Labs    09/04/17 0426 09/04/17 0845  TSH 6.096*  --   T3FREE  --  1.8*    Other results:   Imaging    No results found.   Medications:     Scheduled Medications: . allopurinol  300 mg Oral Daily  . aspirin EC  81 mg Oral Daily  . chlorhexidine  15 mL Mouth Rinse BID  . Chlorhexidine Gluconate Cloth  6 each Topical Daily  . colchicine  0.6 mg Oral BID  . digoxin  0.125 mg Oral Daily  . famotidine  20 mg Oral QHS  . feeding supplement (ENSURE ENLIVE)  237 mL Oral BID BM  . ipratropium  0.5 mg Nebulization TID  . levalbuterol  0.63 mg Nebulization TID  . mouth rinse  15 mL Mouth Rinse q12n4p  . metolazone  5 mg Oral BID  . potassium chloride  40 mEq Oral QID  . sodium chloride flush  10-40 mL Intracatheter Q12H    Infusions: . sodium chloride Stopped (09/05/17 1001)  . amiodarone 30 mg/hr  (09/06/17 0754)  . amiodarone Stopped (09/05/17 1905)  . furosemide (LASIX) infusion 15 mg/hr (09/06/17 0600)  . heparin 1,150 Units/hr (09/06/17 0600)  . milrinone 0.125 mcg/kg/min (09/05/17 1007)  . norepinephrine (LEVOPHED) Adult infusion Stopped (09/05/17 1001)    PRN Medications: acetaminophen **OR** acetaminophen, guaiFENesin-dextromethorphan, levalbuterol, ondansetron **OR** ondansetron (ZOFRAN) IV, oxyCODONE, sodium chloride, sodium chloride flush    Patient Profile   Emily [name] is an 84 year old with a history of  A fib, chronic respiratory failure on home oxygen, GERD, HTN, dementia, and diastolic heart failure.   Admitted with marked volume overload and A fib RVR  Assessment/Plan   1. Shock--> Acute/Chronic RV Failure - ?High output state - ECHO 09/01/17: EF 60-65%  - Marked volume overload. Good UOP with IV lasix, but she is getting a lot of IVF with drips - Continue lasix drip 15 mg/hr and metolazone 5 mg BID. May need to start diamox.  - CVP 23 - Continue milrinone 0.125 mcg/kg/min. SBP 90-110s. Coox 81% - Continue midodrine 5 mg TID. Now off norepi.  - Continue digoxin 0.125 mg daily  - Continue unna boots - She is a DNR/I - Plan to treat aggressively for 24-48 more hours. If she improves, consider RHC. If she does not improve, would move toward hospice. - Unable to concentrate any of her drips - discussed with PharmD  2. A fib RVR with hx of PAF - Rates 110-130s - Continue amio drip 30 mg per hour.  - Continue heparin drip.  - May need TEE/DCCV once diuresed. She is not on AC at home. No change.   3. ?CKD 3 - Improving with diuresis - Creatinine stable 1.20 Monitor daily BMET  4. Hypokalemia - K 3.5 this am. Supp.  5. Chronic respiratory failure - On home 2 L O2 - Now on 6 L [name] - Afebrile. No leukocytosis.   6. Dementia/AMS - Ammonia normal 34. No change.   7. Gout - Uric acid elevated 15.5. Now on colchicine and oxycodone. No change.   8.  Hypothyroidism - TSH elevated to 6. T4 normal, T3 slightly low. Discussed with pharmD. Will not treat at this time. No change.   9. Anemia - Hemoglobin 8.0. Received ferraheme 6/26. Denies bleeding. No change.   Medication concerns reviewed with patient and pharmacy team. Barriers identified: cognitive  Length of Stay: 6  Emily Highland, NP  09/06/2017, 9:47 AM  Advanced Heart Failure Team Pager [phone] (M-F; 7a - 4p)  Please contact [facility] for night-coverage after hours (4p -7a )  and weekends on [email or web address]  Dr Gala RomneyBensimhon discussed pt's condition with pt's son-in-law. Agreeable to Palliative consult. Order placed.   Emily HighlandAshley M Smith, NP  Agree with above.   She is much worse today. More lethargic. Poor response to IV lasix despite high CVP, marked volume overload, intoropic support and adequate co-ox. Now also has increasing O2 demand   On exam Lethargic and weak. Cannot move much in bed Jvp to ear Cor IRR 2/6 TR +RV lift Lungs clear anteriorly Ab markedly obese NT/ND Ext 2-3+ edema  She continues to decline and I had long talk with her son-in-law about the fact that I think she is end-stage and there is likely not a way to return to any meaningful functional state. He is in agreement and sid they had just been trying to get her a "few more months". She is DNR/DNI and hve agreed to Palliative Consult. I will speak to family further tomorrow.   CRITICAL CARE Performed by: Emily [name], Emily [name]  Total critical care time: 35 minutes  Critical care time was exclusive of separately billable procedures and treating other patients.  Critical care was necessary to treat or prevent imminent or life-threatening deterioration.  Critical care was time spent personally by me (independent of midlevel providers or residents) on the following activities: development of treatment plan with patient and/or surrogate as well as nursing, discussions with consultants, evaluation of patient's  response to treatment, examination of patient, obtaining history from patient or surrogate, ordering and performing treatments and interventions, ordering and review of laboratory studies, ordering and review of radiographic studies, pulse oximetry and re-evaluation of patient's condition.  Emily Meresaniel Mako Pelfrey, MD  10:35 PM

## 2017-09-06 NOTE — Progress Notes (Signed)
  ANTICOAGULATION CONSULT NOTE  Pharmacy Consult for  heparin dosing Indication: atrial fibrillation  Allergies  Allergen Reactions  . Sulfa Antibiotics Rash  . Quinine Derivatives Other (See Comments)    Bleeding from mouth. Affteced bone marrow. Decreased WBC.   Patient Measurements: Height: 5\' 4"  (162.6 cm) Weight: 269 lb 2.9 oz (122.1 kg) IBW/kg (Calculated) : 54.7 HEPARIN DW (KG): 83.9  Vital Signs: Temp: 98.2 F (36.8 C) (06/28 1144) Temp Source: Oral (06/28 1144) BP: 108/69 (06/28 1200) Pulse Rate: 124 (06/28 1200)  Labs: Recent Labs    09/04/17 0426  09/05/17 0537 09/05/17 1440 09/06/17 0443  HGB 8.2*  --  8.0*  --  8.0*  HCT 27.1*  --  27.0*  --  27.0*  PLT 200  --  217  --  183  HEPARINUNFRC 0.43  --  0.32  --  0.33  CREATININE 1.29*   < > 1.15* 1.18* 1.20*   < > = values in this interval not displayed.   Estimated Creatinine Clearance: 45 mL/min (A) (by C-G formula based on SCr of 1.2 mg/dL (H)).  Assessment: [age] yof with atrial fibrillation admitted with leg swelling and chronic diastolic/RV failure CHF, started on milrinone and [facility]   Heparin level remains therapeutic at 0.33, on 1150 units/hr. Hgb 8.0, plt 183. No s/sx of bleeding- small nose bleed earlier and nursing did notice slight bleeding when brushing teeth. No reported infusion issues.   Goal of Therapy:   Heparin level 0.3-0.7 IU/mL   Plan:  Continue heparin infusion at 1150 units/hr Check anti-Xa level daily while on heparin Continue to monitor H&H and platelets   Jenetta DownerJessica Natika Geyer, Pharm D, BCPS, [facility] Clinical Pharmacist Phone [phone]  09/06/2017 12:38 PM

## 2017-09-07 DIAGNOSIS — R579 Shock, unspecified: Secondary | ICD-10-CM

## 2017-09-07 LAB — BASIC METABOLIC PANEL
ANION GAP: 9 (ref 5–15)
BUN: 51 mg/dL — ABNORMAL HIGH (ref 8–23)
CHLORIDE: 95 mmol/L — AB (ref 98–111)
CO2: 37 mmol/L — ABNORMAL HIGH (ref 22–32)
CREATININE: 1.22 mg/dL — AB (ref 0.44–1.00)
Calcium: 8.4 mg/dL — ABNORMAL LOW (ref 8.9–10.3)
GFR calc Af Amer: 46 mL/min — ABNORMAL LOW (ref >60)
GFR, EST NON AFRICAN AMERICAN: 40 mL/min — AB (ref >60)
Glucose, Bld: 98 mg/dL (ref 70–99)
POTASSIUM: 3.3 mmol/L — AB (ref 3.5–5.1)
SODIUM: 141 mmol/L (ref 135–145)

## 2017-09-07 LAB — BLOOD GAS, ARTERIAL
Acid-Base Excess: 8.2 mmol/L — ABNORMAL HIGH (ref 0.0–2.0)
BICARBONATE: 31.6 mmol/L — AB (ref 20.0–28.0)
DRAWN BY: 22223
O2 Saturation: 93.9 %
PO2 ART: 68.5 mmHg — AB (ref 83.0–108.0)
pCO2 arterial: 54.4 mmHg — ABNORMAL HIGH (ref 32.0–48.0)
pH, Arterial: 7.401 (ref 7.350–7.450)

## 2017-09-07 LAB — COOXEMETRY PANEL
CARBOXYHEMOGLOBIN: 1.6 % — AB (ref 0.5–1.5)
Methemoglobin: 1.6 % — ABNORMAL HIGH (ref 0.0–1.5)
O2 Saturation: 70.9 %
Total hemoglobin: 8.2 g/dL — ABNORMAL LOW (ref 12.0–16.0)

## 2017-09-07 LAB — CBC
HCT: 27.3 % — ABNORMAL LOW (ref 36.0–46.0)
HEMOGLOBIN: 7.9 g/dL — AB (ref 12.0–15.0)
MCH: 27 pg (ref 26.0–34.0)
MCHC: 28.9 g/dL — ABNORMAL LOW (ref 30.0–36.0)
MCV: 93.2 fL (ref 78.0–100.0)
PLATELETS: 201 10*3/uL (ref 150–400)
RBC: 2.93 MIL/uL — AB (ref 3.87–5.11)
RDW: 14.5 % (ref 11.5–15.5)
WBC: 5 10*3/uL (ref 4.0–10.5)

## 2017-09-07 LAB — MAGNESIUM: MAGNESIUM: 1.9 mg/dL (ref 1.7–2.4)

## 2017-09-07 LAB — HEPARIN LEVEL (UNFRACTIONATED): Heparin Unfractionated: 0.26 IU/mL — ABNORMAL LOW (ref 0.30–0.70)

## 2017-09-07 MED ORDER — SENNOSIDES-DOCUSATE SODIUM 8.6-50 MG PO TABS
1.0000 | ORAL_TABLET | Freq: Every day | ORAL | Status: DC
  Administered 2017-09-07 – 2017-09-11 (×4): 1 via ORAL
  Filled 2017-09-07 (×5): qty 1

## 2017-09-07 MED ORDER — BISACODYL 10 MG RE SUPP
10.0000 mg | Freq: Every day | RECTAL | Status: DC | PRN
  Administered 2017-09-07: 10 mg via RECTAL
  Filled 2017-09-07: qty 1

## 2017-09-07 MED ORDER — DIPHENHYDRAMINE HCL 50 MG/ML IJ SOLN
12.5000 mg | Freq: Four times a day (QID) | INTRAMUSCULAR | Status: DC | PRN
  Administered 2017-09-08 – 2017-09-10 (×4): 12.5 mg via INTRAVENOUS
  Filled 2017-09-07 (×5): qty 1

## 2017-09-07 MED ORDER — HYDROMORPHONE HCL 1 MG/ML IJ SOLN: 0.2500 mg | INTRAMUSCULAR | Status: DC | PRN

## 2017-09-07 NOTE — Progress Notes (Signed)
Follow up - Critical Care Medicine Note  Patient Details:    Emily [name] is an 84 y.o. female.  Longstanding history of predominantly right-sided heart failure with several presentations with generalized edema.  Increasing frequency of hospitalizations recently with increasing apparently diuretic resistant edema.   Presented originally at [facility] with edema, dyspnea and hypotension - similar to prior admissions. Eventually transferred to [facility] for AHF Service evaluation.  General feeling, which has been communicated to family is that therapeutic options are limited and that hospice may be best option.  Past Medical History:  Diagnosis Date  . A-fib (HCC)   . CHF (congestive heart failure) (HCC)   . Dementia   . GERD (gastroesophageal reflux disease)   . HTN (hypertension)   . Hypertension      Lines, Airways, Drains: PICC Triple Lumen 09/02/17 PICC Right Brachial 40 cm 1 cm (Active)  Indication for Insertion or Continuance of Line Vasoactive infusions 09/06/2017  8:00 AM  Exposed Catheter (cm) 1 cm 09/02/2017  6:00 PM  Site Assessment Clean;Dry;Intact 09/06/2017  8:00 AM  Lumen #1 Status Infusing 09/06/2017  8:00 AM  Lumen #2 Status Infusing 09/06/2017  8:00 AM  Lumen #3 Status Flushed;In-line blood sampling system in place 09/06/2017  8:00 AM  Dressing Type Transparent;Occlusive 09/06/2017  8:00 AM  Dressing Status Clean;Dry;Intact 09/06/2017  8:00 AM  Line Care Connections checked and tightened;Zeroed and calibrated;Transducer changed;Leveled 09/06/2017  8:00 AM  Dressing Change Due 09/09/17 09/06/2017  8:00 AM     Urethral Catheter Demetrio Lapping RN Non-latex 14 Fr. (Active)  Indication for Insertion or Continuance of Catheter Aggressive IV diuresis 09/06/2017  8:00 AM  Site Assessment Clean;Intact;Dry;Edema 09/06/2017  8:00 AM  Catheter Maintenance Bag below level of bladder;Catheter secured;Drainage bag/tubing not touching floor;Insertion date on drainage bag;No dependent loops;Seal  intact;Bag emptied prior to transport 09/06/2017  8:00 AM  Collection Container Standard drainage bag 09/06/2017  8:00 AM  Securement Method Securing device (Describe) 09/06/2017  8:00 AM  Urinary Catheter Interventions Unclamped 09/05/2017  8:00 PM  Input (mL) 0 mL 09/03/2017  4:00 PM  Output (mL) 300 mL 09/06/2017 12:00 PM    Anti-infectives:  Anti-infectives (From admission, onward)   None      Microbiology: Results for orders placed or performed during the hospital encounter of 08/31/17  MRSA PCR Screening     Status: None   Collection Time: 08/31/17  9:24 PM  Result Value Ref Range Status   MRSA by PCR NEGATIVE NEGATIVE Final    Comment:        The GeneXpert MRSA Assay (FDA approved for NASAL specimens only), is one component of a comprehensive MRSA colonization surveillance program. It is not intended to diagnose MRSA infection nor to guide or monitor treatment for MRSA infections. Performed at [facility], [address] [place], [place] [address]    BMET    Component Value Date/Time   NA 141 09/07/2017 0357   K 3.3 (L) 09/07/2017 0357   CL 95 (L) 09/07/2017 0357   CO2 37 (H) 09/07/2017 0357   GLUCOSE 98 09/07/2017 0357   BUN 51 (H) 09/07/2017 0357   CREATININE 1.22 (H) 09/07/2017 0357   CALCIUM 8.4 (L) 09/07/2017 0357   GFRNONAA 40 (L) 09/07/2017 0357   GFRAA 46 (L) 09/07/2017 0357   CBC    Component Value Date/Time   WBC 5.0 09/07/2017 0357   RBC 2.93 (L) 09/07/2017 0357   HGB 7.9 (L) 09/07/2017 0357   HCT 27.3 (L) 09/07/2017  0357   PLT 201 09/07/2017 0357   MCV 93.2 09/07/2017 0357   MCH 27.0 09/07/2017 0357   MCHC 28.9 (L) 09/07/2017 0357   RDW 14.5 09/07/2017 0357   LYMPHSABS 0.6 (L) 08/31/2017 1530   MONOABS 0.7 08/31/2017 1530   EOSABS 0.5 08/31/2017 1530   BASOSABS 0.0 08/31/2017 1530    Best Practice/Protocols:  VTE Prophylaxis: Heparin (drip)  Events:   Admitted on 6/26 and started on milrinone and furosemide infusion. Amiodarone  added for AF rate control.    Started on treatment for gout flare L foot 6/27.  Milrinone and norepinephrine weaned off 6.27 due to tachycardia.  Furosemide infusion stopped on 6/28 because of contraction alkalosis.  AHF Service has consulted Palliative Care on  6/28.  Studies: Dg Chest Port 1 View  Result Date: 09/04/2017 CLINICAL DATA:  Acute respiratory failure EXAM: PORTABLE CHEST 1 VIEW COMPARISON:  09/02/2017 FINDINGS: Cardiac shadow remains enlarged. Right-sided PICC line is noted in satisfactory position. Mild bibasilar atelectatic changes are noted. Mild vascular congestion is again seen. IMPRESSION: Mild vascular congestion and bibasilar atelectasis. Electronically Signed   By: Alcide Clever M.D.   On: 09/04/2017 08:07   Consults: Treatment Team:  Antoine Poche, MD   Subjective:    Overnight Issues: Hematuria around Foley catheter.  Aggressive treatment has been discontinued. Patient is resting comfortably but is agitated at times from periodic puritus.  Objective:  Vital signs for last 24 hours: Temp:  [97.6 F (36.4 C)-98.7 F (37.1 C)] 98.7 F (37.1 C) (06/29 0800) Pulse Rate:  [98-124] 111 (06/29 1000) Resp:  [13-27] 15 (06/29 1000) BP: (78-122)/(45-70) 106/53 (06/29 1000) SpO2:  [91 %-97 %] 93 % (06/29 1000) Weight:  [270 lb 1 oz (122.5 kg)] 270 lb 1 oz (122.5 kg) (06/29 0500)  Hemodynamic parameters for last 24 hours: CVP:  [17 mmHg-24 mmHg] 17 mmHg  Intake/Output from previous day: 06/28 0701 - 06/29 0700 In: 519.9 [I.V.:519.9] Out: 2550 [Urine:2550]  Intake/Output this shift: Total I/O In: 87.5 [P.O.:50; I.V.:37.5] Out: 325 [Urine:325]  Vent settings for last 24 hours:  On 6 Lpm  Physical Exam:  General: no respiratory distress and appear frail and fatigued. Neuro:somnolent, nonfocal exam and generalized weakness. HEENT/Neck: JVD severe and CV waves consistent with tricuspid regurgitation. Resp: bronchophony bibasilar and diminished breath  sounds bibasilar CVS: prominent apical impulse. S1, S2 unremarkable. 2/6 HSM best heard at RSB. Extremities warm. GI: Protuberant abdomen but soft  Skin: Unna boots on both feet. Frail skin with ecchymoses. Extremities: marked edema of lower extremities in dependent areas.  Assessment/Plan:   NEURO  Dementia. Increasingly somnolent today. Mild hypercapnea on ABG but patient unable to tolerate CPAP.    Plan: In discussion with daughter, will transition to comfort care approach. Dilaudid for pain and Benadryl for itching.  PULM  Hypoxia secondary to combination of mild pulmonary edema and atelectasis from prolonged recumbency.   Plan: Oxygen for comfort.  CARDIO  End-stage decompensated right heart failure and severe tricuspid regurgitation with anasarca.  Limited long-term therapeutic options.      Plan:   Convert comfort care. Continue oral heart failure medications only.  RENAL  Acute on chronic renal insufficiency.  Creatinine level likely under represents degree of renal dysfunction given poor muscle mass.  Mild increase in creatinine and rising CO2 suggests intravascular volume contraction.  Metabolic alkalosis may also be compensatory for undiagnosed hypoventilation. Her edema is asymmetric and predominantly involves the lower extremities, thus some of her edema may in  fact be due to to her obesity and poor venous return rather than heart failure.   Plan: Continue oral diuretics only.  GI  Nutritional Deficiency High nutritional risk.  Limited appetite.   Plan: Continue to encourage enteral nutrition.  ID  Presently no signs of intercurrent infection.   Plan: No antibiotic therapy for now  HEME  Anemia anemia of chronic disease) Presently anticoagulated for atrial fibrillation   Plan: Continue IV heparin for now.  Consider converting to oral anticoagulation soon.  ENDO  euglycemic on no insulin therapy with no history of diabetes.   Plan: Continue to monitor.  Global Issues    Advanced right-sided heart failure with tricuspid regurgitation.  Limited therapeutic interventions especially in light of age and comorbidities.  While there appears to have been some stabilization of her condition, overall progress towards discharge in independent living has been limited. Thus will begin the process of converting medications back over to oral therapy in view of a more comfort oriented approach in the next few days.   LOS: 7 days   Additional comments: Patient is DNR.  Daughter has been updated at bedside.  Transition to comfort care.  Lynnell Catalanavi Barlow Harrison, MD [facility] ICU Physician [facility]  Pager: [phone] Mobile: [phone] After hours: [phone].  09/07/2017  *Care during the described time interval was provided by me and/or other providers on the critical care team.  I have reviewed this patient's available data, including medical history, events of note, physical examination and test results as part of my evaluation.

## 2017-09-07 NOTE — Progress Notes (Signed)
   Patient transitioned to Comfort Care. Will follow as needed.  Arvilla Meresaniel Bensimhon, MD  11:34 AM

## 2017-09-07 NOTE — Progress Notes (Signed)
  ANTICOAGULATION CONSULT NOTE  Pharmacy Consult for  heparin dosing Indication: atrial fibrillation  Allergies  Allergen Reactions  . Sulfa Antibiotics Rash  . Quinine Derivatives Other (See Comments)    Bleeding from mouth. Affteced bone marrow. Decreased WBC.   Patient Measurements: Height: 5\' 4"  (162.6 cm) Weight: 270 lb 1 oz (122.5 kg) IBW/kg (Calculated) : 54.7 HEPARIN DW (KG): 83.9  Vital Signs: Temp: 97.6 F (36.4 C) (06/29 0345) Temp Source: Axillary (06/29 0345) BP: 112/63 (06/29 0400) Pulse Rate: 112 (06/29 0400)  Labs: Recent Labs    09/05/17 0537 09/05/17 1440 09/06/17 0443 09/07/17 0357  HGB 8.0*  --  8.0* 7.9*  HCT 27.0*  --  27.0* 27.3*  PLT 217  --  183 201  HEPARINUNFRC 0.32  --  0.33 0.26*  CREATININE 1.15* 1.18* 1.20*  --    Estimated Creatinine Clearance: 45.1 mL/min (A) (by C-G formula based on SCr of 1.2 mg/dL (H)).  Assessment: 89 yof with atrial fibrillation admitted with leg swelling and chronic diastolic/RV failure CHF, started on milrinone and [facility]   Heparin level now 0.26 units/ml.  Slight bleeding around foley per RN.  No further nose bleeds reported  Goal of Therapy:   Heparin level 0.3-0.7 IU/mL   Plan:  Increase heparin infusion to 1250 units/hr Check antiXa level later today Check anti-Xa level daily while on heparin Continue to monitor H&H and platelets   Thanks for allowing pharmacy to be a part of this patient's care.  Talbert CageLora Brennah Quraishi, PharmD Clinical Pharmacist  09/07/2017 5:07 AM

## 2017-09-08 DIAGNOSIS — Z7189 Other specified counseling: Secondary | ICD-10-CM

## 2017-09-08 DIAGNOSIS — Z515 Encounter for palliative care: Secondary | ICD-10-CM

## 2017-09-08 MED ORDER — LORAZEPAM 2 MG/ML IJ SOLN
1.0000 mg | INTRAMUSCULAR | Status: DC | PRN
  Administered 2017-09-10 – 2017-09-11 (×2): 1 mg via INTRAVENOUS
  Filled 2017-09-08 (×2): qty 1

## 2017-09-08 MED ORDER — ORAL CARE MOUTH RINSE
15.0000 mL | Freq: Two times a day (BID) | OROMUCOSAL | Status: DC
  Administered 2017-09-08 – 2017-09-12 (×7): 15 mL via OROMUCOSAL

## 2017-09-08 MED ORDER — HALOPERIDOL LACTATE 2 MG/ML PO CONC
0.5000 mg | ORAL | Status: DC | PRN
Start: 2017-09-08 — End: 2017-09-12
  Filled 2017-09-08: qty 0.3

## 2017-09-08 MED ORDER — HALOPERIDOL 0.5 MG PO TABS
0.5000 mg | ORAL_TABLET | ORAL | Status: DC | PRN
  Filled 2017-09-08: qty 1

## 2017-09-08 MED ORDER — LORAZEPAM 1 MG PO TABS
1.0000 mg | ORAL_TABLET | ORAL | Status: DC | PRN
  Administered 2017-09-10: 1 mg via ORAL
  Filled 2017-09-08: qty 1

## 2017-09-08 MED ORDER — HALOPERIDOL LACTATE 5 MG/ML IJ SOLN: 0.5000 mg | INTRAMUSCULAR | Status: DC | PRN

## 2017-09-08 MED ORDER — LORAZEPAM 2 MG/ML PO CONC: 1.0000 mg | ORAL | Status: DC | PRN

## 2017-09-08 NOTE — Plan of Care (Signed)
  Problem: Health Behavior/Discharge Planning: Goal: Ability to manage health-related needs will improve Outcome: Progressing   Problem: Clinical Measurements: Goal: Respiratory complications will improve Outcome: Progressing   

## 2017-09-08 NOTE — Consult Note (Signed)
Consultation Note Date: 09/08/2017   Patient Name: Emily [name]  DOB: 10-15-1931  MRN: [id number]  Age / Sex: 84 y.o., female  PCP: Emily Blitz, MD Referring Physician: Kipp Brood, MD  Reason for Consultation: Establishing goals of care and Hospice Evaluation  HPI/Patient Profile: 84 y.o. female  with past medical history of A fib, chronic respiratory failure on home oxygen, GERD, HTN, dementia, and diastolic heart failure admitted on 08/31/2017 with diuretic resistant edema, dyspnea and hypotension. Also found to be in a fib RVR. No improvement despite milrinone and lasix drips.  Critical care team spoke with family yesterday about transitioning to comfort care. PMT consulted for Emily [name] and hospice evaluation.  Clinical Assessment and Goals of Care: I have reviewed medical records including EPIC notes, labs and imaging, received report from RN, assessed the patient and then met at the bedside along with patient's 3 daughters, granddaughter, and 2 son-in-laws to discuss diagnosis prognosis, GOC, EOL wishes, disposition and options.  Patient is somnolent and unable to participate in Aneth discussion.  I introduced Palliative Medicine as specialized medical care for people living with serious illness. It focuses on providing relief from the symptoms and stress of a serious illness. The goal is to improve quality of life for both the patient and the family.  We discussed a brief life review of the patient. She was married and had 4 children. Spouse passed away a few years ago under hospice care. Close knit family. Patient has lived with her youngest daughter Emily [name] and her husband for a while.    As far as functional and nutritional status, they tell me about a decline. She used a walker to ambulate but did not walk much further than to the bathroom and back. Recent decline in appetite. Has a diagnosis of dementia a year ago and was started on medication.  They tell me they have not noticed any cognitive changes - seemed cognitively intact.    We discussed her current illness and what it means in the larger context of her on-going co-morbidities.  Natural disease trajectory and expectations at EOL were discussed. They understand she is nearing end of life. We discussed that treatment options have been exhausted with poor response.  I attempted to elicit values and goals of care important to the patient.    The difference between aggressive medical intervention and comfort care was considered in light of the patient's goals of care. They would like to focus on comfort and quality of life moving forward.  Advanced directives, concepts specific to code status, artifical feeding and hydration, and rehospitalization were considered and discussed. Confirmed DNR. No artificial prolongation of life.   Hospice and Palliative Care services outpatient were explained and offered. We discussed hospice philosophy. They are familiar as their father was in hospice care. They feel a hospice facility would be most appropriate.  Discussed symptom management - only concern is pain in feet and cough. They deny shortness of breath and she did not appear dyspneic on my exam.   Questions and concerns were addressed. The family was encouraged to call with questions or concerns.   Primary Decision Maker NEXT OF KIN - children - 3 daughters present and in agreement with plan of care  SUMMARY OF RECOMMENDATIONS   -full comfort care -keep diuretics for comfort -transfer to residential hospice facility - family requests [facility] - discussed with social work and referral sent  Code Status/Advance Care Planning:  DNR  Symptom Management:   Ordered by  CCM yesterday - dilaudid 0.25 mg q3hr, robitussin prn cough - patient has not required dilaudid and currently is resting comfortably  Palliative Prophylaxis:   Aspiration, Frequent Pain Assessment, Oral Care  and Turn Reposition  Additional Recommendations (Limitations, Scope, Preferences):  Full Comfort Care  Psycho-social/Spiritual:   Desire for further Chaplaincy support:no  Additional Recommendations: Education on Hospice  Prognosis:   Hours - Days  Discharge Planning: Hospice facility      Primary Diagnoses: Present on Admission: . Hypotension . A-fib ([place]) . GERD (gastroesophageal reflux disease) . Hypertension . Hypokalemia . Hypoalbuminemia . Dementia   I have reviewed the medical record, interviewed the patient and family, and examined the patient. The following aspects are pertinent.  Past Medical History:  Diagnosis Date  . A-fib ([place])   . CHF (congestive heart failure) ([place])   . Dementia   . GERD (gastroesophageal reflux disease)   . HTN (hypertension)   . Hypertension    Social History   Socioeconomic History  . Marital status: Married    Spouse name: Not on file  . Number of children: Not on file  . Years of education: Not on file  . Highest education level: Not on file  Occupational History  . Not on file  Social Needs  . Financial resource strain: Not on file  . Food insecurity:    Worry: Not on file    Inability: Not on file  . Transportation needs:    Medical: Not on file    Non-medical: Not on file  Tobacco Use  . Smoking status: Never Smoker  . Smokeless tobacco: Never Used  Substance and Sexual Activity  . Alcohol use: No  . Drug use: No  . Sexual activity: Not on file  Lifestyle  . Physical activity:    Days per week: Not on file    Minutes per session: Not on file  . Stress: Not on file  Relationships  . Social connections:    Talks on phone: Not on file    Gets together: Not on file    Attends religious service: Not on file    Active member of club or organization: Not on file    Attends meetings of clubs or organizations: Not on file    Relationship status: Not on file  Other Topics Concern  . Not on file  Social  History Narrative  . Not on file   Family History  Problem Relation Age of Onset  . Colon cancer Neg Hx   . Colon polyps Neg Hx    Scheduled Meds: . amiodarone  200 mg Oral Daily  . digoxin  0.125 mg Oral Daily  . famotidine  20 mg Oral QHS  . feeding supplement (ENSURE ENLIVE)  237 mL Oral BID BM  . mouth rinse  15 mL Mouth Rinse BID  . metolazone  5 mg Oral BID  . metoprolol tartrate  12.5 mg Oral BID  . senna-docusate  1 tablet Oral QHS  . sodium chloride flush  10-40 mL Intracatheter Q12H  . torsemide  40 mg Oral BID   Continuous Infusions: . sodium chloride Stopped (09/05/17 1001)   PRN Meds:.acetaminophen **OR** acetaminophen, bisacodyl, diphenhydrAMINE, guaiFENesin-dextromethorphan, HYDROmorphone (DILAUDID) injection, levalbuterol, ondansetron **OR** ondansetron (ZOFRAN) IV, oxyCODONE, sodium chloride Allergies  Allergen Reactions  . Sulfa Antibiotics Rash  . Quinine Derivatives Other (See Comments)    Bleeding from mouth. Affteced bone marrow. Decreased WBC.   Review of Systems  Unable to perform ROS: Acuity of condition  Physical Exam  Constitutional: She has a sickly appearance. No distress.  Frail, does not respond to verbal or physical stimulation, has responded to family some  HENT:  Head: Normocephalic and atraumatic.  Cardiovascular: Normal rate. An irregular rhythm present.  Pulmonary/Chest: Effort normal. No accessory muscle usage. No respiratory distress.  Clear breath sounds anteriorly  Abdominal: Soft.  Musculoskeletal:       Right lower leg: She exhibits edema.       Left lower leg: She exhibits edema.  Neurological: She is disoriented.  Skin: Skin is warm and dry.  Psychiatric:  Not interactive    Vital Signs: BP (!) 102/56   Pulse (!) 103   Temp 97.6 F (36.4 C) (Axillary)   Resp 13   Ht 5' 4"  (1.626 m)   Wt 122.5 kg (270 lb 1 oz)   SpO2 97%   BMI 46.36 kg/m  Pain Scale: 0-10 POSS *See Group Information*: 1-Acceptable,Awake and  alert Pain Score: 0-No pain   SpO2: SpO2: 97 % O2 Device:SpO2: 97 % O2 Flow Rate: .O2 Flow Rate (L/min): 6 L/min  IO: Intake/output summary:   Intake/Output Summary (Last 24 hours) at 09/08/2017 0929 Last data filed at 09/08/2017 0900 Gross per 24 hour  Intake 122.5 ml  Output 2000 ml  Net -1877.5 ml    LBM: Last BM Date: 09/08/17 Baseline Weight: Weight: 117.9 kg (260 lb) Most recent weight: Weight: 122.5 kg (270 lb 1 oz)     Palliative Assessment/Data: PPS 20%    Time Total: 60 minutes Greater than 50%  of this time was spent counseling and coordinating care related to the above assessment and plan.  Juel Burrow, DNP, AGNP-C Palliative Medicine Team [phone] Pager: [phone]

## 2017-09-08 NOTE — Progress Notes (Signed)
Patient arrived to 6n26 A&O to self only, RUA Picc and LFA intact and locked.  Noted to have foley inserted and on 6L O2 [place].  Family at bedside.  Family oriented to room and equipment.  Comfort cart ordered.  Will continue to monitor.

## 2017-09-08 NOTE — Progress Notes (Signed)
Pt now hospice care. As requested[facility] [phone]). Currently no beds available at agency. Will call CSW when bed becomes available.

## 2017-09-09 NOTE — Social Work (Addendum)
CSW called and left voicemail on confidential line for [place] [facility].  Await return call for status of bed availability.   2:46pm- Return call [facility], pt still on wait list, requesting updated Palliative note.   Doy HutchingIsabel H Jarion Hawthorne, LCSWA [facility] Clinical Social Work [phone]

## 2017-09-09 NOTE — Progress Notes (Signed)
Daily Progress Note   Patient Name: Emily [name]       Date: 09/09/2017 DOB: Aug 08, 1931  Age: 84 y.o. MRN#: [id number] Attending Physician: Lynnell Catalan, MD Primary Care Physician: Kirstie Peri, MD Admit Date: 08/31/2017  Reason for Consultation/Follow-up: Hospice Evaluation, Inpatient hospice referral, Non pain symptom management, Pain control, Psychosocial/spiritual support and Terminal Care  Subjective: Friends at bedside, patient is alert but confused, complains of itching - asked nurse for benadryl  Length of Stay: 9  Current Medications: Scheduled Meds:  . amiodarone  200 mg Oral Daily  . digoxin  0.125 mg Oral Daily  . famotidine  20 mg Oral QHS  . feeding supplement (ENSURE ENLIVE)  237 mL Oral BID BM  . mouth rinse  15 mL Mouth Rinse BID  . metolazone  5 mg Oral BID  . metoprolol tartrate  12.5 mg Oral BID  . senna-docusate  1 tablet Oral QHS  . sodium chloride flush  10-40 mL Intracatheter Q12H  . torsemide  40 mg Oral BID    Continuous Infusions: . sodium chloride Stopped (09/05/17 1001)    PRN Meds: acetaminophen **OR** acetaminophen, bisacodyl, diphenhydrAMINE, guaiFENesin-dextromethorphan, haloperidol **OR** haloperidol **OR** haloperidol lactate, HYDROmorphone (DILAUDID) injection, levalbuterol, LORazepam **OR** LORazepam **OR** LORazepam, ondansetron **OR** ondansetron (ZOFRAN) IV, oxyCODONE, sodium chloride  Physical Exam   Constitutional: She has a sickly appearance. No distress.  Frail HENT:  Head: Normocephalic and atraumatic.  Cardiovascular: Normal rate. An irregular rhythm present.  Pulmonary/Chest: Effort normal. No accessory muscle usage. No respiratory distress.  Clear breath sounds anteriorly  Abdominal: Soft.  Musculoskeletal:       Right lower leg: She  exhibits edema.       Left lower leg: She exhibits edema.  Neurological: She is disoriented.  Skin: Skin is warm and dry  Vital Signs: BP (!) 90/50 (BP Location: Left Arm)   Pulse 81   Temp 98 F (36.7 C) (Oral)   Resp 17   Ht 5\' 4"  (1.626 m)   Wt 122.5 kg (270 lb 1 oz)   SpO2 97%   BMI 46.36 kg/m  SpO2: SpO2: 97 % O2 Device: O2 Device: Nasal Cannula O2 Flow Rate: O2 Flow Rate (L/min): 6 L/min  Intake/output summary:   Intake/Output Summary (Last 24 hours) at 09/09/2017 1430 Last data filed at 09/09/2017 1138 Gross per 24 hour  Intake 100 ml  Output 3700 ml  Net -3600 ml   LBM: Last BM Date: 09/08/17 Baseline Weight: Weight: 117.9 kg (260 lb) Most recent weight: Weight: 122.5 kg (270 lb 1 oz)       Palliative Assessment/Data: PPS 20%    Flowsheet Rows     Most Recent Value  Intake Tab  Referral Department  Cardiology  Unit at Time of Referral  Intermediate Care Unit  Palliative Care Primary Diagnosis  Cardiac  Date Notified  09/06/17  Palliative Care Type  New Palliative care  Reason for referral  Clarify Goals of Care  Date of Admission  08/31/17  Date first seen by Palliative Care  09/08/17  # of days Palliative referral response time  2 Day(s)  # of days IP prior to Palliative referral  6  Clinical Assessment  Palliative Performance Scale Score  20%  Psychosocial & Spiritual Assessment  Palliative Care Outcomes  Patient/Family meeting held?  Yes  Who was at the meeting?  3 daughters, granddaughter, 2 son in laws  Palliative Care Outcomes  Clarified goals of care, Provided end of life care assistance, Provided psychosocial or spiritual support, Transitioned to hospice, Counseled regarding hospice, Changed to focus on comfort      Patient Active Problem List   Diagnosis Date Noted  . Goals of care, counseling/discussion   . Palliative care by specialist   . Dementia   . Acute respiratory failure (HCC)   . AKI (acute kidney injury) (HCC)   . Shock  circulatory (HCC)   . Hypotension 08/31/2017  . Hypokalemia 08/31/2017  . Hypoalbuminemia 08/31/2017  . Hypertension   . CHF (congestive heart failure) (HCC)   . GERD (gastroesophageal reflux disease)   . A-fib (HCC)   . Hemorrhoids 05/28/2017    Palliative Care Assessment & Plan   HPI: 84 y.o. female  with past medical history of A fib, chronic respiratory failure on home oxygen, GERD, HTN, dementia, and diastolic heart failure admitted on 08/31/2017 with diuretic resistant edema, dyspnea and hypotension. Also found to be in a fib RVR. No improvement despite milrinone and lasix drips.  Critical care team spoke with family 6/29 about transitioning to comfort care. PMT consulted for GOC and hospice evaluation.  Assessment: No family at bedside - has some friends visiting. Patient is confused but more alert than my assessment yesterday - complains of some itching but does appear comfortable overall. Has benadryl ordered for itching. Requested from RN. [facility]. Appears stable for transport.  Recommendations/Plan:  Patient comfortable, continue comfort care, continue diuretics for comfort  Awaiting hospice home bed.   Goals of Care and Additional Recommendations:  Limitations on Scope of Treatment: Full Comfort Care  Code Status:  DNR  Prognosis:   < 2 weeks  Discharge Planning:  Hospice facility - [facility] requested by family  Thank you for allowing the Palliative Medicine Team to assist in the care of this patient.   Total Time 15 minutes Prolonged Time Billed  no       Greater than 50%  of this time was spent counseling and coordinating care related to the above assessment and plan.  Gerlean RenShae Lee Sven Pinheiro, DNP, [facility] Palliative Medicine Team Team Phone # [phone]  Pager [phone]

## 2017-09-09 NOTE — Progress Notes (Signed)
Nutrition Brief Note  Chart reviewed. Pt now transitioning to comfort care.  No further nutrition interventions warranted at this time.  Please re-consult as needed.   Marlyss Cissell A. Mackynzie Woolford, RD, LDN, CDE Pager: [phone] After hours Pager: [phone]  

## 2017-09-10 DIAGNOSIS — I50812 Chronic right heart failure: Secondary | ICD-10-CM

## 2017-09-10 DIAGNOSIS — Z515 Encounter for palliative care: Secondary | ICD-10-CM

## 2017-09-10 DIAGNOSIS — R0602 Shortness of breath: Secondary | ICD-10-CM

## 2017-09-10 MED ORDER — WHITE PETROLATUM EX OINT
TOPICAL_OINTMENT | CUTANEOUS | Status: AC
  Administered 2017-09-10: 11:00:00
  Filled 2017-09-10: qty 28.35

## 2017-09-10 MED ORDER — AQUAPHOR EX OINT
TOPICAL_OINTMENT | Freq: Two times a day (BID) | CUTANEOUS | Status: DC
Start: 2017-09-10 — End: 2017-09-12
  Administered 2017-09-10 – 2017-09-11 (×4): via TOPICAL
  Filled 2017-09-10: qty 50

## 2017-09-10 MED ORDER — LORAZEPAM 2 MG/ML IJ SOLN
0.5000 mg | Freq: Four times a day (QID) | INTRAMUSCULAR | Status: DC
  Administered 2017-09-10 – 2017-09-11 (×4): 0.5 mg via INTRAVENOUS
  Filled 2017-09-10 (×4): qty 1

## 2017-09-10 NOTE — Progress Notes (Signed)
[facility] PCCM PROGRESS NOTE  S: 84 y.o. female.  Longstanding history of predominantly right-sided heart failure with several presentations with generalized edema.  Increasing frequency of hospitalizations recently with increasing apparently diuretic resistant edema. [facility] with edema, dyspnea and hypotension - similar to prior admissions. [facility].  General feeling, which has been communicated to family is that therapeutic options are limited and that hospice may be best option. She did not respond to diuresis and the multidisciplinary recommendation was palliation. This transition was made 6/29.  Currently she has no complaints and states she is breathing comfortably. Daughter feels as though her symptoms are under control. Notably her terrible itching has improved with benadryl.   O: BP (!) 114/46 (BP Location: Left Arm)   Pulse 81   Temp 97.9 F (36.6 C) (Oral)   Resp 13   Ht 5\' 4"  (1.626 m)   Wt 122.5 kg (270 lb 1 oz)   SpO2 97%   BMI 46.36 kg/m   General:  Frail elderly female in NAD Neuro:  Somnolent but does awaken.  Pulm: normal WOB  A/P: Acute on chronic RV failure Acute on chronic hypoxic respiratory failure Atrial fibrillation CKD III  Plan: Comfort care Continue PRN dilaudid for breakthrough pain Continue diuretics PO for comfort purposes Benadryl for itching Transfer to inpatient hospice facility pending. [facility]  Per social work, a decision on her placement will be made tomorrow. Will keep on PCCM service.   Joneen RoachPaul Paisli Silfies, AGACNP - [facility] Pulmonary/Critical Care Pager [phone] or [phone]) [phone]

## 2017-09-10 NOTE — Social Work (Signed)
CSW spoke with [facility], pt is #3 on wait list.  CSW had spoken with pt daughter briefly this morning, it remains their desire for pt to discharge to Hospice Home instead of home with hospice.   Spoke with MD attending care team for them to round on pt. Last note from pt attending was on 6/29.  CSW continuing to follow.  Emily HutchingIsabel H Kaevon [name], LCSWA [facility] Clinical Social Work [phone]

## 2017-09-10 NOTE — Care Management Important Message (Signed)
Important Message  Patient Details  Name: Emily [name] MRN: [id number] Date of Birth: 03/21/1931   Medicare Important Message Given:  Yes    Dorena BodoIris Joseluis Alessio 09/10/2017, 8:29 AM

## 2017-09-10 NOTE — Progress Notes (Signed)
Daily Progress Note   Patient Name: Emily HumphreysDoris J JamaicaFrench       Date: 09/10/2017 DOB: 08/20/1931  Age: 84 y.o. MRN#: [id number] Attending Physician: Lynnell CatalanAgarwala, Ravi, MD Primary Care Physician: Kirstie PeriShah, Ashish, MD Admit Date: 08/31/2017  Reason for Consultation/Follow-up: Hospice Evaluation, Inpatient hospice referral, Non pain symptom management, Pain control, Psychosocial/spiritual support and Terminal Care  Subjective: Patient alone this morning. Confused. Complains of itching all over. Per RN, benadryl has been ineffective and patient has been slightly uncomfortable and anxious.   Length of Stay: 10  Current Medications: Scheduled Meds:  . amiodarone  200 mg Oral Daily  . digoxin  0.125 mg Oral Daily  . famotidine  20 mg Oral QHS  . feeding supplement (ENSURE ENLIVE)  237 mL Oral BID BM  . LORazepam  0.5 mg Intravenous Q6H  . mouth rinse  15 mL Mouth Rinse BID  . metolazone  5 mg Oral BID  . metoprolol tartrate  12.5 mg Oral BID  . mineral oil-hydrophilic petrolatum   Topical BID  . senna-docusate  1 tablet Oral QHS  . sodium chloride flush  10-40 mL Intracatheter Q12H  . torsemide  40 mg Oral BID    Continuous Infusions: . sodium chloride Stopped (09/05/17 1001)    PRN Meds: acetaminophen **OR** acetaminophen, bisacodyl, diphenhydrAMINE, guaiFENesin-dextromethorphan, haloperidol **OR** haloperidol **OR** haloperidol lactate, HYDROmorphone (DILAUDID) injection, levalbuterol, LORazepam **OR** LORazepam **OR** LORazepam, ondansetron **OR** ondansetron (ZOFRAN) IV, oxyCODONE, sodium chloride  Physical Exam   Constitutional: She has a sickly appearance. No distress.  Frail HENT:  Head: Normocephalic and atraumatic.  Cardiovascular: Normal rate. An irregular rhythm present.  Pulmonary/Chest: Effort  normal. No accessory muscle usage. No respiratory distress.  Clear breath sounds anteriorly  Abdominal: Soft.  Musculoskeletal:       Right lower leg: She exhibits edema.       Left lower leg: She exhibits edema.  Neurological: She is disoriented.  Skin: Skin is warm and dry  Vital Signs: BP (!) 114/46 (BP Location: Left Arm)   Pulse 81   Temp 97.9 F (36.6 C) (Oral)   Resp 13   Ht 5\' 4"  (1.626 m)   Wt 122.5 kg (270 lb 1 oz)   SpO2 97%   BMI 46.36 kg/m  SpO2: SpO2: 97 % O2 Device: O2 Device: Nasal Cannula O2 Flow Rate: O2 Flow Rate (L/min): 6 L/min  Intake/output summary:   Intake/Output Summary (Last 24 hours) at 09/10/2017 1720 Last data filed at 09/10/2017 1625 Gross per 24 hour  Intake 362 ml  Output 4425 ml  Net -4063 ml   LBM: Last BM Date: 09/08/17 Baseline Weight: Weight: 117.9 kg (260 lb) Most recent weight: Weight: 122.5 kg (270 lb 1 oz)       Palliative Assessment/Data: PPS 20%    Flowsheet Rows     Most Recent Value  Intake Tab  Referral Department  Cardiology  Unit at Time of Referral  Intermediate Care Unit  Palliative Care Primary Diagnosis  Cardiac  Date Notified  09/06/17  Palliative Care Type  New Palliative care  Reason for referral  Clarify Goals of Care  Date of Admission  08/31/17  Date first seen by Palliative Care  09/08/17  #  of days Palliative referral response time  2 Day(s)  # of days IP prior to Palliative referral  6  Clinical Assessment  Palliative Performance Scale Score  20%  Psychosocial & Spiritual Assessment  Palliative Care Outcomes  Patient/Family meeting held?  Yes  Who was at the meeting?  3 daughters, granddaughter, 2 son in laws  Palliative Care Outcomes  Clarified goals of care, Provided end of life care assistance, Provided psychosocial or spiritual support, Transitioned to hospice, Counseled regarding hospice, Changed to focus on comfort      Patient Active Problem List   Diagnosis Date Noted  . Shortness of  breath   . Goals of care, counseling/discussion   . Palliative care by specialist   . Dementia   . Acute respiratory failure (HCC)   . AKI (acute kidney injury) (HCC)   . Shock circulatory (HCC)   . Hypotension 08/31/2017  . Hypokalemia 08/31/2017  . Hypoalbuminemia 08/31/2017  . Hypertension   . CHF (congestive heart failure) (HCC)   . GERD (gastroesophageal reflux disease)   . A-fib (HCC)   . Hemorrhoids 05/28/2017    Palliative Care Assessment & Plan   HPI: 84 y.o. female  with past medical history of A fib, chronic respiratory failure on home oxygen, GERD, HTN, dementia, and diastolic heart failure admitted on 08/31/2017 with diuretic resistant edema, dyspnea and hypotension. Also found to be in a fib RVR. No improvement despite milrinone and lasix drips.  Critical care team spoke with family 6/29 about transitioning to comfort care. PMT consulted for GOC and hospice evaluation.  Assessment: No family at bedside. Patient is confused but alert. Complains of itching all over. Has benadryl ordered - ineffective per nursing. Will add aquaphor and schedule small dose of ativan. Social work is [facility] at [facility]. Appears stable for transport.  Recommendations/Plan:  Added aquaphor and scheduled 0.5 mg ativan IV q6hr, continue prn ativan as well. Has dilaudid for pain/dypsnea but has not required  Awaiting hospice home bed.   Goals of Care and Additional Recommendations:  Limitations on Scope of Treatment: Full Comfort Care  Code Status:  DNR  Prognosis:   < 2 weeks  Discharge Planning:  Hospice facility - [facility] requested by family  Thank you for allowing the Palliative Medicine Team to assist in the care of this patient.   Total Time 25 minutes Prolonged Time Billed  no       Greater than 50%  of this time was spent counseling and coordinating care related to the above assessment and plan.  Gerlean Ren, DNP,  [facility] Palliative Medicine Team Team Phone # [phone]  Pager [phone]

## 2017-09-11 DIAGNOSIS — L299 Pruritus, unspecified: Secondary | ICD-10-CM

## 2017-09-11 MED ORDER — LORAZEPAM 2 MG/ML IJ SOLN
0.2500 mg | Freq: Four times a day (QID) | INTRAMUSCULAR | Status: DC
  Administered 2017-09-11 – 2017-09-12 (×4): 0.25 mg via INTRAVENOUS
  Filled 2017-09-11 (×4): qty 1

## 2017-09-11 NOTE — Care Management Note (Signed)
Case Management Note  Patient Details  Name: Emily EagleDoris J Colclasure MRN: [id number] Date of Birth: 04/24/1931  Subjective/Objective:                    Action/Plan:  Discussed discharge plan with daughter Dala [phone]/ [facility] still [facility].   Discussed other hospice homes vs home with hospice . Dala discussed same with her sister. They have decided to to take their mother [facility].   Patient has hospital bed, 3 in1, oxygen ( [employer]).   Confirmed patient will need transportation home via ambulance.   [facility] phone [phone], fax [phone] .   At discharge tomorrow NCM will arrange PTAR transportation home and [facility] call nurse [phone].  Dr Vassie LollAlva aware. Expected Discharge Date:  09/02/17               Expected Discharge Plan:  Home w Hospice Care  In-House Referral:  Clinical Social Work  Discharge planning Services  CM Consult  Post Acute Care Choice:  Hospice Choice offered to:  Adult Children  DME Arranged:  N/A DME Agency:  NA  HH Arranged:    HH Agency:  [facility]  Status of Service:  In process, will continue to follow  If discussed at Long Length of Stay Meetings, dates discussed:    Additional Comments:  Kingsley PlanWile, Mattias Walmsley Marie, RN 09/11/2017, 4:03 PM

## 2017-09-11 NOTE — Social Work (Addendum)
Pt remains at #3 on the [facility], if pt remains stable there may need to be a conversation around another hospice home placement or home with hospice services.  3:10pm- CSW spoke with pt daughter Emily [name], discussed that the pt remains on the wait list and that she continues to be stable. CSW discussed that unfortunately we are unable to continue to hold pt for placement, given that she is medically stable and there are other options that we may explore, such as home with hospice or alternate hospice home placement. Dala expressed understanding, asked if there is a hospice home in [place] (there is not). Pt daughter states she will speak  with her siblings and see if they are able to assist with home hospice care until bed available.   3:14pm- Pt daughter Emily [name] spoke with her siblings they are amenable to home with hospice until [facility] home bed [facility] available.   CSW signing off. Please consult if any additional needs arise.  Doy HutchingIsabel H Ettore Trebilcock, LCSWA [facility] Clinical Social Work [phone]

## 2017-09-11 NOTE — Discharge Summary (Signed)
Physician Discharge Summary  Patient ID: Emily [name] MRN: [id number] DOB/AGE: 82-Apr-1933 82 y.o.  Admit date: 08/31/2017 Discharge date: 09/12/2017                           DISCHARGE PLAN BY DIAGNOSIS    End stage decompensated RV failure with severe tricuspid regurgitation. Atrial fibrillation. Acute on chronic hypoxic respiratory failure. AoCKD III. Anasarca. Failure to thrive. Dementia. Plan: Discharge to home with in home hospice. Continue PRN dilaudid, PRN ativan, PRN benadryl.                 DISCHARGE SUMMARY   Emily [name] is a 84 y.o. y/o female with a PMH of Diastolic Heart failure, A. Fib, chronic respiratory failure on home O2, GERD, hypertension.  She was admitted to [facility] on 08/31/17 with complaints of increased leg swelling, weight increase, and weakness.  Initial ED evaluation at [facility] 08/31/17  revealed BNP of 400, chest x-ray with mild CHF, acute renal failure with creatinine of 1.4 (baseline creatinine 0.5-1 in 2017), and hypotension.  She had also had ~30lbb weight gain over the preceding few weeks. While at [facility], she was treated for right-sided heart failure and volume overload that was poorly responsive to IV diuretics and AKI.  Course was complicated by hypotension requiring Milrinone and Neo-Synehprine infusions, as well as A-Fib w/ RVR requiring Amiodarone and Heparin infusions.   She was transferred to [facility] on 09/03/17 for further evaluation and management by the Heart Failure team.  She was given lasix but this was limited due to hypotension.  She was subsequently started on vasopressors.  She had sluggish UOP so was started on lasix infusion + metolazone 6/25.  Course then complicated by AFRVR for which she was started on amiodarone and heparin.    In light of age and co-morbidities, it was felt that therapies / therapeutic options were limited and overall progress towards discharge and independent living were limited.  After  extensive discussions with family, decision was made to transition to more of a comfort care approach.  She was subsequently transferred to the floor and palliative care was consulted to assist.  Plans were to discharge to [facility]; however, due to no bed availability, family were open to discharging home with in home hospice.  On 7/4, she was cleared for discharge home with in home hospice.Presciptions written.   SIGNIFICANT DIAGNOSTIC STUDIES Echo 6/23 > EF 60 - 65%, RV overload, mod - severe TR, PAP 34.  SIGNIFICANT EVENTS 6/22 > admitted to [facility]. 6/25 > transferred to [facility]. 6/29 > transferred out of ICU, transitioned to comfort care. 7/4 > discharged home with in home hospice.   MICRO DATA  None.  ANTIBIOTICS None.  CONSULTS Cardiology. Advanced Heart Failure. Palliative Care Medicine.  TUBES / LINES None.     Vitals:   09/10/17 2039 09/11/17 0608 09/11/17 1517 09/12/17 0453  BP: (!) 105/52 (!) 106/47 (!) 122/52 (!) 111/55  Pulse: 78 70 70 66  Resp: 18 18 18 16   Temp: 98.1 F (36.7 C) 97.8 F (36.6 C) 98.2 F (36.8 C) 97.8 F (36.6 C)  TempSrc: Oral Oral Oral Axillary  SpO2: 94% 98% 94% 94%  Weight:      Height:         Discharge Labs  BMET Recent Labs  Lab 09/05/17 1440 09/06/17 0443 09/07/17 0357  NA 141 141 141  K 3.6 3.5 3.3*  CL 96* 95* 95*  CO2 34* 37* 37*  GLUCOSE 147* 105* 98  BUN 52* 51* 51*  CREATININE 1.18* 1.20* 1.22*  CALCIUM 8.5* 8.6* 8.4*  MG  --  2.1 1.9    CBC Recent Labs  Lab 09/06/17 0443 09/07/17 0357  HGB 8.0* 7.9*  HCT 27.0* 27.3*  WBC 5.1 5.0  PLT 183 201    Anti-Coagulation No results for input(s): INR in the last 168 hours.     [place] Follow up.   Contact information: [address] [place] [address] [phone]            Allergies as of 09/12/2017      Reactions   Sulfa Antibiotics Rash   Quinine Derivatives Other (See Comments)    Bleeding from mouth. Affteced bone marrow. Decreased WBC.      Medication List    STOP taking these medications   aspirin EC 81 MG tablet   diltiazem 120 MG 24 hr capsule Commonly known as:  CARDIZEM CD   Ferrous Fumarate 324 (106 Fe) MG Tabs tablet Commonly known as:  HEMOCYTE - 106 mg FE   multivitamin tablet   oxybutynin 5 MG tablet Commonly known as:  DITROPAN     TAKE these medications   albuterol (2.5 MG/3ML) 0.083% nebulizer solution Commonly known as:  PROVENTIL USE ONE VIAL IN NEBULIZER FOUR TIMES DAILY AS NEEDED FOR SHORTNESS OF BREATH/WHEEZING   amiodarone 200 MG tablet Commonly known as:  PACERONE Take 1 tablet (200 mg total) by mouth daily.   cetirizine 10 MG tablet Commonly known as:  ZYRTEC Take 10 mg by mouth daily.   digoxin 0.125 MG tablet Commonly known as:  LANOXIN Take 1 tablet (0.125 mg total) by mouth daily.   donepezil 5 MG tablet Commonly known as:  ARICEPT Take 5 mg by mouth every morning.   famotidine 20 MG tablet Commonly known as:  PEPCID Take 20 mg by mouth at bedtime.   guaiFENesin-dextromethorphan 100-10 MG/5ML syrup Commonly known as:  ROBITUSSIN DM Take 5 mLs by mouth every 4 (four) hours as needed for cough.   hydrocortisone 2.5 % rectal cream Commonly known as:  ANUSOL-HC Apply rectally up to four times daily as directed What changed:  See the new instructions.   hydrocortisone 25 MG suppository Commonly known as:  ANUSOL-HC Place 1 suppository (25 mg total) rectally every 12 (twelve) hours. What changed:    when to take this  reasons to take this   LORazepam 1 MG tablet Commonly known as:  ATIVAN Take 1 tablet (1 mg total) by mouth every 4 (four) hours as needed for anxiety.   lubiprostone 8 MCG capsule Commonly known as:  AMITIZA Take 8 mcg by mouth 2 (two) times daily with a meal.   metolazone 5 MG tablet Commonly known as:  ZAROXOLYN Take 1 tablet (5 mg total) by mouth 2 (two) times daily. What changed:     medication strength  how much to take  when to take this   metoprolol tartrate 25 MG tablet Commonly known as:  LOPRESSOR Take 0.5 tablets (12.5 mg total) by mouth 2 (two) times daily. What changed:    medication strength  how much to take   montelukast 10 MG tablet Commonly known as:  SINGULAIR Take 10 mg by mouth every evening.   oxyCODONE 5 MG immediate release tablet Commonly known as:  Oxy IR/ROXICODONE Take 0.5 tablets (2.5 mg total) by mouth  every 6 (six) hours as needed for moderate pain or severe pain.   potassium chloride SA 20 MEQ tablet Commonly known as:  K-DUR,KLOR-CON Take 10 mEq by mouth 2 (two) times daily.   sodium chloride 0.65 % Soln nasal spray Commonly known as:  OCEAN Place 1 spray into both nostrils as needed for congestion.   torsemide 20 MG tablet Commonly known as:  DEMADEX Take 2 tablets (40 mg total) by mouth 2 (two) times daily. What changed:    medication strength  how much to take  when to take this        Disposition: Home with in home hospice.   Discharged Condition: Emily [name] has met maximum benefit of inpatient care and is medically stable and cleared for discharge.  Patient is pending follow up as above.      Time spent on disposition:  Greater than 35 minutes.

## 2017-09-11 NOTE — Progress Notes (Signed)
Daily Progress Note   Patient Name: Emily [name]       Date: 09/11/2017 DOB: 12-13-1931  Age: 84 y.o. MRN#: [id number] Attending Physician: Lynnell Catalan, MD Primary Care Physician: Kirstie Peri, MD Admit Date: 08/31/2017  Reason for Consultation/Follow-up: Hospice Evaluation, Inpatient hospice referral, Non pain symptom management, Pain control, Psychosocial/spiritual support and Terminal Care  Subjective: Daughter at bedside today. Patient is less responsive, confused, still complaining of itching. Denies pain and shortness of breath.   Length of Stay: 11  Current Medications: Scheduled Meds:  . amiodarone  200 mg Oral Daily  . digoxin  0.125 mg Oral Daily  . famotidine  20 mg Oral QHS  . feeding supplement (ENSURE ENLIVE)  237 mL Oral BID BM  . LORazepam  0.25 mg Intravenous Q6H  . mouth rinse  15 mL Mouth Rinse BID  . metolazone  5 mg Oral BID  . metoprolol tartrate  12.5 mg Oral BID  . mineral oil-hydrophilic petrolatum   Topical BID  . senna-docusate  1 tablet Oral QHS  . sodium chloride flush  10-40 mL Intracatheter Q12H  . torsemide  40 mg Oral BID    Continuous Infusions: . sodium chloride Stopped (09/05/17 1001)    PRN Meds: acetaminophen **OR** acetaminophen, bisacodyl, diphenhydrAMINE, guaiFENesin-dextromethorphan, haloperidol **OR** haloperidol **OR** haloperidol lactate, HYDROmorphone (DILAUDID) injection, levalbuterol, LORazepam **OR** LORazepam **OR** LORazepam, ondansetron **OR** ondansetron (ZOFRAN) IV, oxyCODONE, sodium chloride  Physical Exam   Constitutional: She has a sickly appearance. No distress.  Frail HENT:  Head: Normocephalic and atraumatic.  Cardiovascular: Normal rate. An irregular rhythm present.  Pulmonary/Chest: Effort normal. No accessory muscle usage.  No respiratory distress.  Clear breath sounds anteriorly  Abdominal: Soft.  Musculoskeletal:       Right lower leg: She exhibits edema.       Left lower leg: She exhibits edema.  Neurological: She is disoriented.  Skin: Skin is warm and dry  Vital Signs: BP (!) 106/47 (BP Location: Left Arm)   Pulse 70   Temp 97.8 F (36.6 C) (Oral)   Resp 18   Ht 5\' 4"  (1.626 m)   Wt 122.5 kg (270 lb 1 oz)   SpO2 98%   BMI 46.36 kg/m  SpO2: SpO2: 98 % O2 Device: O2 Device: Nasal Cannula O2 Flow Rate: O2 Flow Rate (L/min): 3 L/min  Intake/output summary:   Intake/Output Summary (Last 24 hours) at 09/11/2017 0917 Last data filed at 09/11/2017 0344 Gross per 24 hour  Intake 717 ml  Output 3725 ml  Net -3008 ml   LBM: Last BM Date: 09/08/17 Baseline Weight: Weight: 117.9 kg (260 lb) Most recent weight: Weight: 122.5 kg (270 lb 1 oz)       Palliative Assessment/Data: PPS 20%    Flowsheet Rows     Most Recent Value  Intake Tab  Referral Department  Cardiology  Unit at Time of Referral  Intermediate Care Unit  Palliative Care Primary Diagnosis  Cardiac  Date Notified  09/06/17  Palliative Care Type  New Palliative care  Reason for referral  Clarify Goals of Care  Date of Admission  08/31/17  Date first seen by Palliative Care  09/08/17  # of days Palliative  referral response time  2 Day(s)  # of days IP prior to Palliative referral  6  Clinical Assessment  Palliative Performance Scale Score  20%  Psychosocial & Spiritual Assessment  Palliative Care Outcomes  Patient/Family meeting held?  Yes  Who was at the meeting?  3 daughters, granddaughter, 2 son in laws  Palliative Care Outcomes  Clarified goals of care, Provided end of life care assistance, Provided psychosocial or spiritual support, Transitioned to hospice, Counseled regarding hospice, Changed to focus on comfort      Patient Active Problem List   Diagnosis Date Noted  . Shortness of breath   . Comfort measures only  status   . Goals of care, counseling/discussion   . Palliative care by specialist   . Dementia   . Acute respiratory failure (HCC)   . AKI (acute kidney injury) (HCC)   . Shock circulatory (HCC)   . Hypotension 08/31/2017  . Hypokalemia 08/31/2017  . Hypoalbuminemia 08/31/2017  . Hypertension   . CHF (congestive heart failure) (HCC)   . GERD (gastroesophageal reflux disease)   . A-fib (HCC)   . Hemorrhoids 05/28/2017    Palliative Care Assessment & Plan   HPI: 84 y.o. female  with past medical history of A fib, chronic respiratory failure on home oxygen, GERD, HTN, dementia, and diastolic heart failure admitted on 08/31/2017 with diuretic resistant edema, dyspnea and hypotension. Also found to be in a fib RVR. No improvement despite milrinone and lasix drips.  Critical care team spoke with family 6/29 about transitioning to comfort care. PMT consulted for GOC and hospice evaluation.  Assessment: Daughter at bedside. Patient is confused and lethargic. Complains of itching all over. Aquaphor has helped some and ativan has helped her agitation/anxiety per daughter. Daughter does request scheduled dose be reduced as she feels like patient was too sleepy. I do see that she required 1 mg of PRN ativan overnight. Has benadryl ordered - ineffective per nursing. Social [facility]. Appears stable for transport.  Recommendations/Plan:  Reduced ativan to 0.25mg  q6hr scheduled per daughter request, still has PRN dose if needed. Has dilaudid for pain/dypsnea but has not required  Awaiting hospice home bed.   Goals of Care and Additional Recommendations:  Limitations on Scope of Treatment: Full Comfort Care  Code Status:  DNR  Prognosis:   < 2 weeks  Discharge Planning:  [facility]osp[facility] facility [facility] requested by family  Thank you for allowing the Palliative Medicine Team to assist in the care of this patient.   Total Time 25 minutes  Prolonged Time Billed  no       Greater than 50%  of this time was spent counseling and coordinating care related to the above assessment and plan.  Gerlean RenShae Lee Takao Lizer, DNP, [facility] Palliative Medicine Team Team Phone # [phone]  Pager [phone]

## 2017-09-11 NOTE — Progress Notes (Signed)
Acute on chronic diastolic heart failure Acute on chronic hypoxic respiratory failure Atrial fibrillation CKD III  She is awaiting inpatient hospice placement.  Discussed with daughter, as intermittent breakthrough agitation.  She is on Ativan every 6 hours and Dilaudid as needed. Can transfer to triad service DNR  Comer Locketakesh V. Vassie LollAlva MD  [phone]

## 2017-09-11 NOTE — Consult Note (Signed)
  Spoke with PICC nurse to inform that lne is at 3cm today during dressing change, per Xray, this is still a good position for IV fluids

## 2017-09-12 MED ORDER — METOPROLOL TARTRATE 25 MG PO TABS: 12.5000 mg | ORAL_TABLET | Freq: Two times a day (BID) | ORAL | 3 refills | Status: AC

## 2017-09-12 MED ORDER — GUAIFENESIN-DM 100-10 MG/5ML PO SYRP: 5.0000 mL | ORAL_SOLUTION | ORAL | 0 refills | Status: AC | PRN

## 2017-09-12 MED ORDER — TORSEMIDE 20 MG PO TABS: 40.0000 mg | ORAL_TABLET | Freq: Two times a day (BID) | ORAL | 3 refills | Status: AC

## 2017-09-12 MED ORDER — AMIODARONE HCL 200 MG PO TABS: 200.0000 mg | ORAL_TABLET | Freq: Every day | ORAL | 2 refills | Status: AC

## 2017-09-12 MED ORDER — OXYCODONE HCL 5 MG PO TABS: 2.5000 mg | ORAL_TABLET | Freq: Four times a day (QID) | ORAL | 0 refills | Status: AC | PRN

## 2017-09-12 MED ORDER — SALINE SPRAY 0.65 % NA SOLN: 1.0000 | NASAL | 0 refills | Status: AC | PRN

## 2017-09-12 MED ORDER — METOLAZONE 5 MG PO TABS: 5.0000 mg | ORAL_TABLET | Freq: Two times a day (BID) | ORAL | 3 refills | Status: AC

## 2017-09-12 MED ORDER — DIGOXIN 125 MCG PO TABS: 0.1250 mg | ORAL_TABLET | Freq: Every day | ORAL | 2 refills | Status: AC

## 2017-09-12 MED ORDER — LORAZEPAM 1 MG PO TABS: 1.0000 mg | ORAL_TABLET | ORAL | 0 refills | Status: AC | PRN

## 2017-09-12 NOTE — Progress Notes (Signed)
Discharge medications reviewed with the Pt's daughter. She was aware when the Pt took her last medications, and where to pick up her prescriptions.  Pt was given her prescriptions by the NP of Pulmonary.   Pt left via Ptar to home with Hospice

## 2017-09-12 NOTE — Progress Notes (Signed)
Comfort goals met on present rx/ for d/c home to hospice today.   Christinia Gully, MD Pulmonary and [place] [phone] After 5:30 PM or weekends, use [facility] [phone]

## 2017-09-12 NOTE — Progress Notes (Signed)
Pt scratches and pulls at her PICC line.  Spoke to Dr. Sherene SiresWert about pulling her line this AM. Verbal order received via phone.  Placed order to be pulled. Pt family member advised

## 2017-09-12 NOTE — Care Management Note (Signed)
Case Management Note  Patient Details  Name: Emily [name] MRN: [id number] Date of Birth: 07/22/1931  Subjective/Objective:                    Action/Plan:  Family including Emily [name] at bedside. Patient and family ready for discharge. Confirmed with bedside nurse. Family members are at home awaiting patient's arrival.  PTAR called est pick to be 30 to 45 minutes. [facility] [phone]. [name] [phone] to make aware of discharge. Expected Discharge Date:  09/12/17               Expected Discharge Plan:  Home w Hospice Care  In-House Referral:  Clinical Social Work  Discharge planning Services  CM Consult  Post Acute Care Choice:  Hospice Choice offered to:  Adult Children  DME Arranged:  N/A DME Agency:  NA  HH Arranged:    HH Agency:  [facility]  Status of Service:  Completed, signed off  If discussed at Long Length of Stay Meetings, dates discussed:    Additional Comments:  Kingsley PlanWile, Zoraya Fiorenza Marie, RN 09/12/2017, 11:19 AM

## 2017-09-18 ENCOUNTER — Ambulatory Visit: Payer: Medicare Other | Admitting: Cardiovascular Disease

## 2017-09-19 ENCOUNTER — Ambulatory Visit: Payer: Medicare Other | Admitting: Cardiovascular Disease

## 2017-10-10 DEATH — deceased

## 2017-12-02 ENCOUNTER — Ambulatory Visit: Payer: Medicare Other | Admitting: Gastroenterology

## 2018-08-17 IMAGING — CR DG CHEST 1V PORT
1 series · 1 of 1 positions shown · non-contrast
Comparison: Chest x-ray 07/25/2017.

CLINICAL DATA: 86-year-old female with history of swelling in the
legs. Weight gain over the past 2 weeks.

EXAM:
PORTABLE CHEST 1 VIEW

[portable]
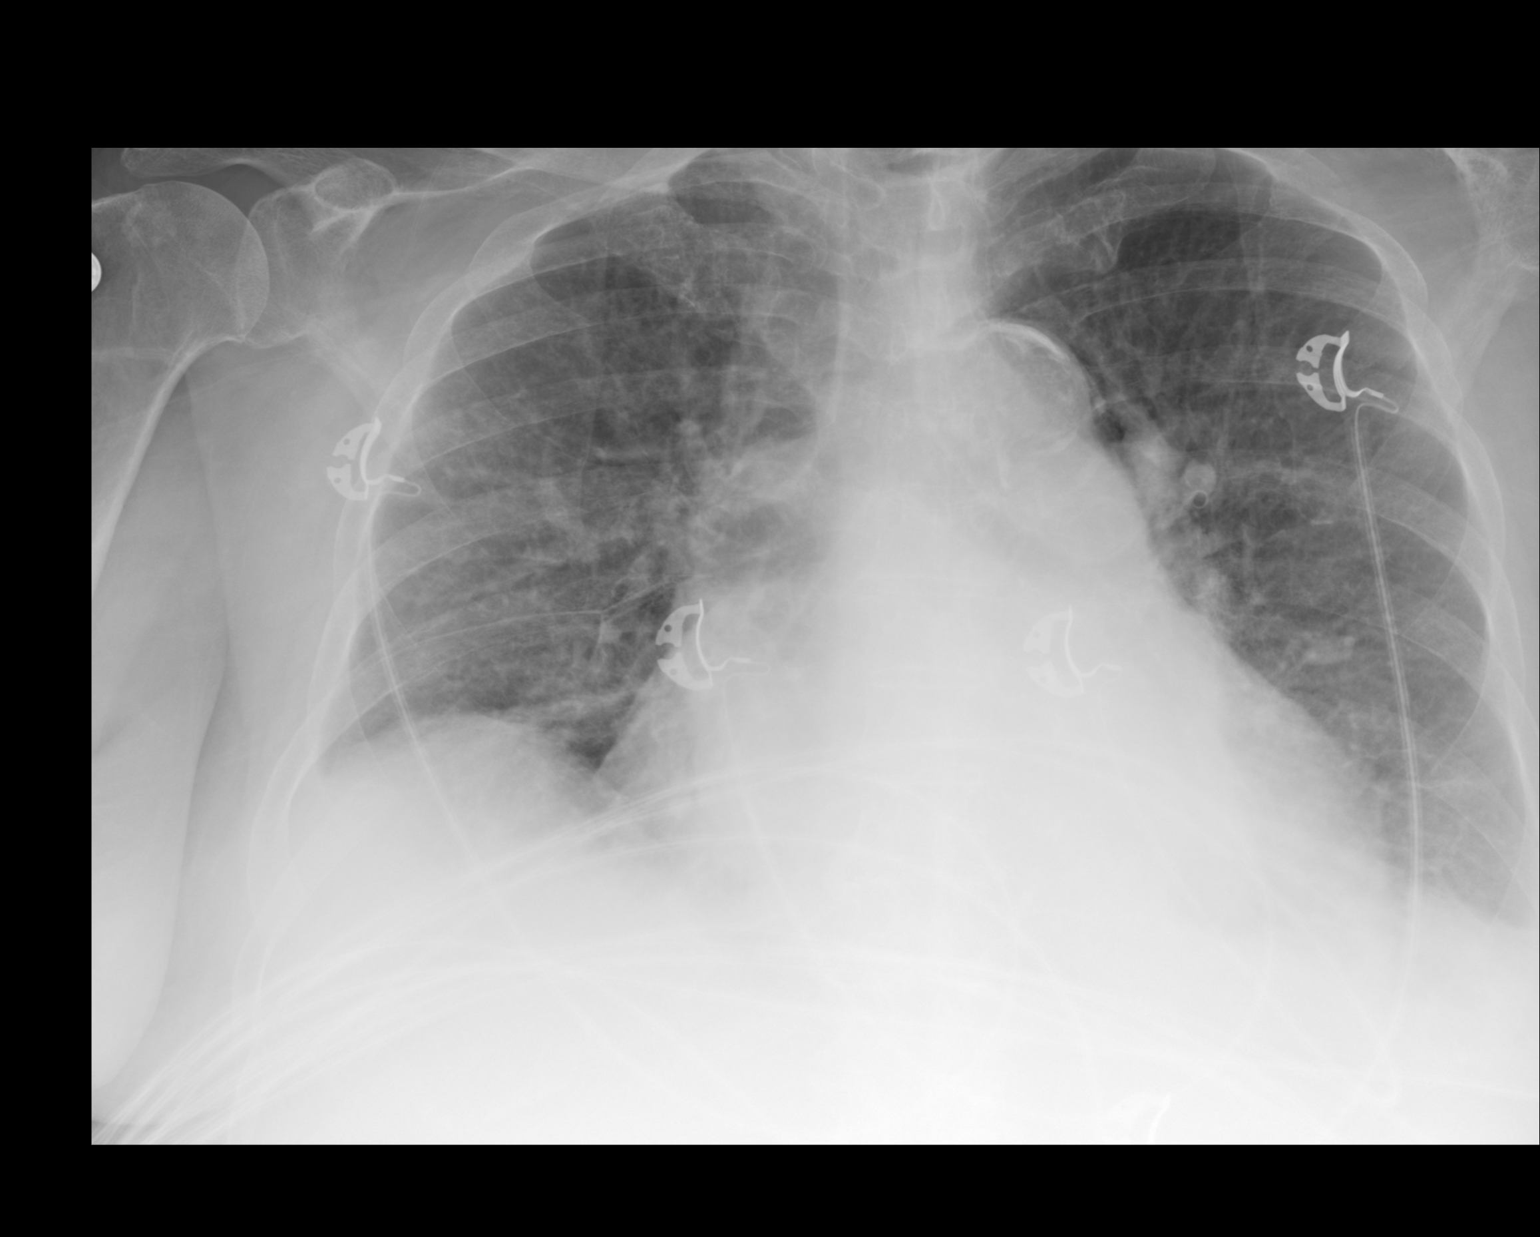

[1 of 1 positions shown; findings below may reference images not displayed]

FINDINGS: There is cephalization of the pulmonary vasculature and slight
indistinctness of the interstitial markings suggestive of mild
pulmonary edema. Small bilateral pleural effusions. Mild
cardiomegaly. Aortic atherosclerosis.
IMPRESSION: 1. The appearance the chest suggests mild congestive heart failure.
2. Aortic atherosclerosis.

## 2018-08-21 IMAGING — DX DG CHEST 1V PORT
1 series · 1 of 1 positions shown · non-contrast
Comparison: 09/02/2017

CLINICAL DATA: Acute respiratory failure

EXAM:
PORTABLE CHEST 1 VIEW

[chest ap]
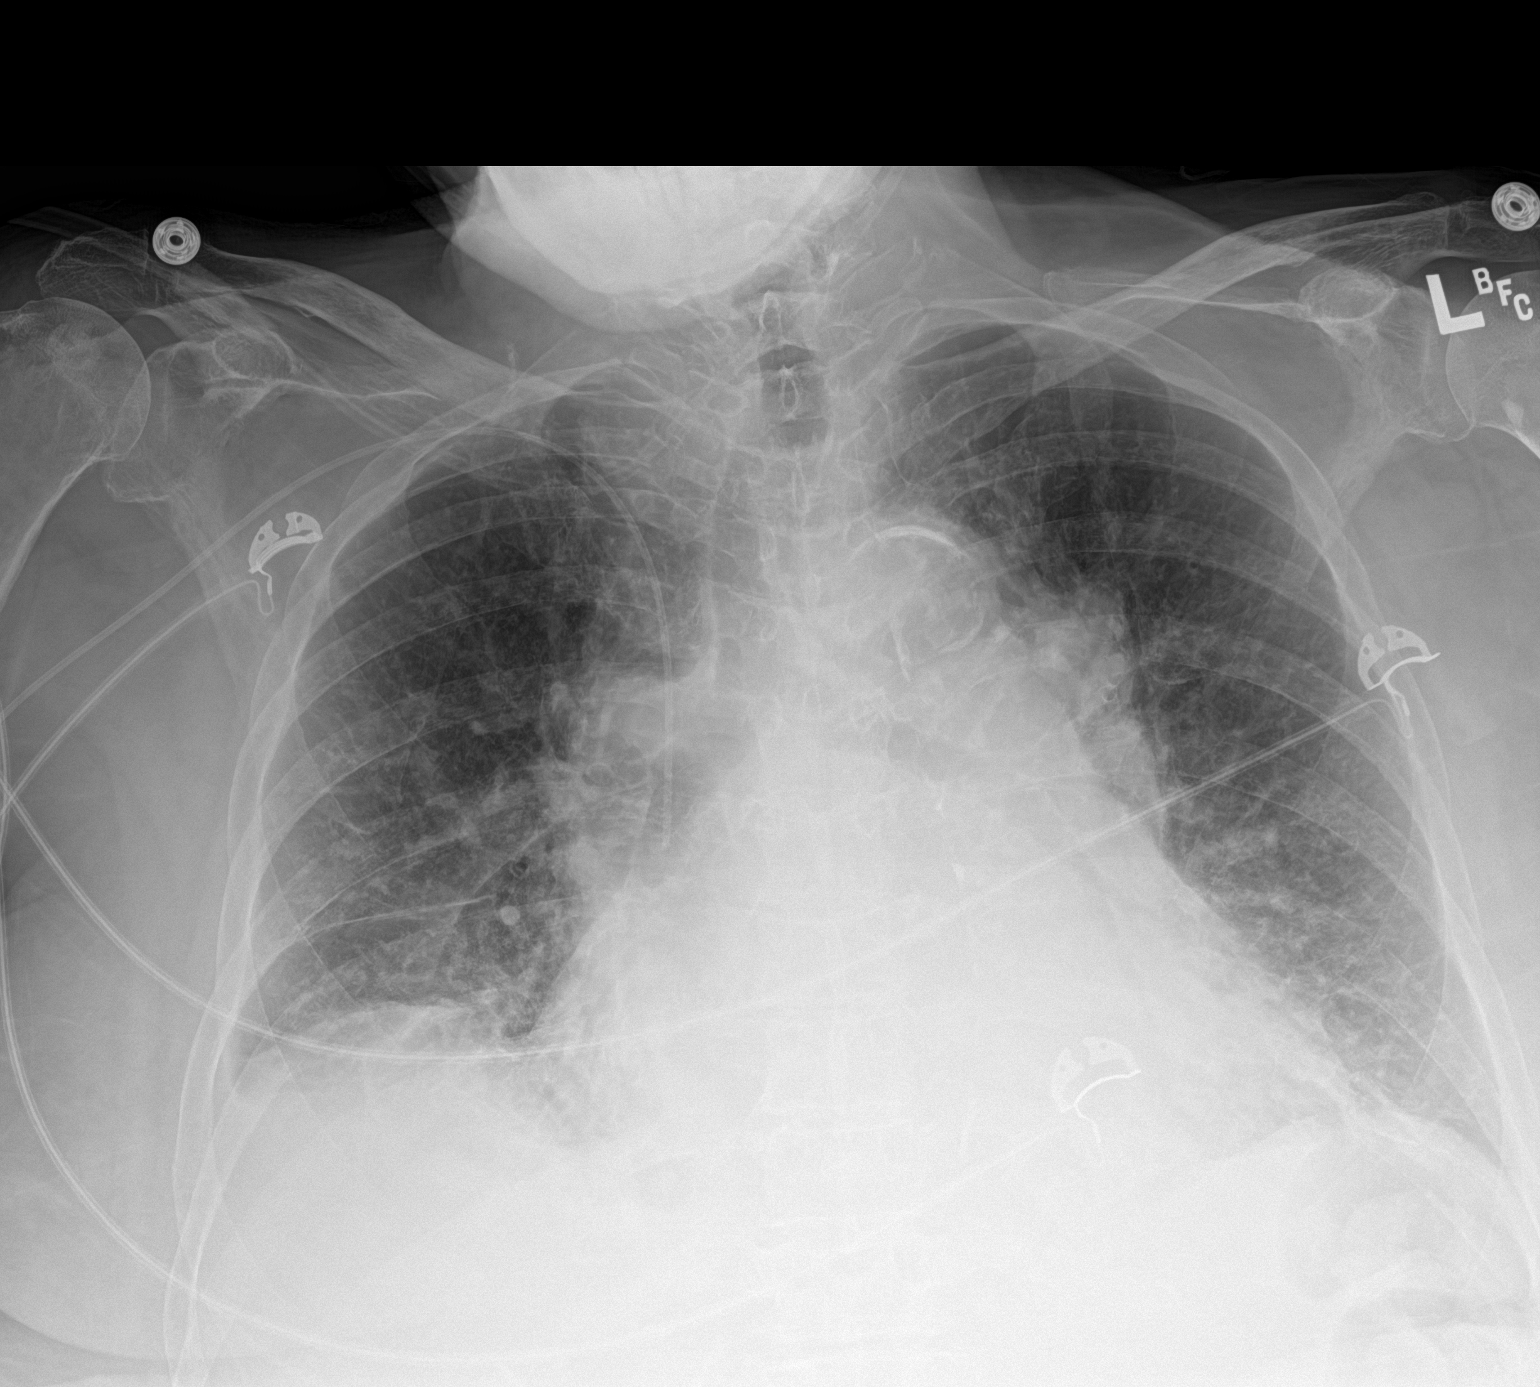

[1 of 1 positions shown; findings below may reference images not displayed]

FINDINGS: Cardiac shadow remains enlarged. Right-sided PICC line is noted in
satisfactory position. Mild bibasilar atelectatic changes are noted.
Mild vascular congestion is again seen.
IMPRESSION: Mild vascular congestion and bibasilar atelectasis.
